# Patient Record
Sex: Female | Born: 1965
Health system: Southern US, Community
[De-identification: ages and names within clinical notes are randomized; demographics above are authoritative.]

## PROBLEM LIST (undated history)

## (undated) DIAGNOSIS — T7840XA Allergy, unspecified, initial encounter: Secondary | ICD-10-CM

## (undated) DIAGNOSIS — E785 Hyperlipidemia, unspecified: Secondary | ICD-10-CM

## (undated) DIAGNOSIS — K743 Primary biliary cirrhosis: Secondary | ICD-10-CM

## (undated) DIAGNOSIS — E119 Type 2 diabetes mellitus without complications: Secondary | ICD-10-CM

## (undated) DIAGNOSIS — K219 Gastro-esophageal reflux disease without esophagitis: Secondary | ICD-10-CM

## (undated) DIAGNOSIS — F259 Schizoaffective disorder, unspecified: Secondary | ICD-10-CM

## (undated) DIAGNOSIS — G43909 Migraine, unspecified, not intractable, without status migrainosus: Secondary | ICD-10-CM

## (undated) DIAGNOSIS — F32A Depression, unspecified: Secondary | ICD-10-CM

## (undated) DIAGNOSIS — B1920 Unspecified viral hepatitis C without hepatic coma: Secondary | ICD-10-CM

## (undated) DIAGNOSIS — F329 Major depressive disorder, single episode, unspecified: Secondary | ICD-10-CM

## (undated) HISTORY — DX: Unspecified viral hepatitis C without hepatic coma: B19.20

## (undated) HISTORY — DX: Gastro-esophageal reflux disease without esophagitis: K21.9

## (undated) HISTORY — PX: TUBAL LIGATION: SHX77

## (undated) HISTORY — DX: Hyperlipidemia, unspecified: E78.5

## (undated) HISTORY — DX: Depression, unspecified: F32.A

## (undated) HISTORY — DX: Migraine, unspecified, not intractable, without status migrainosus: G43.909

## (undated) HISTORY — DX: Allergy, unspecified, initial encounter: T78.40XA

## (undated) HISTORY — DX: Major depressive disorder, single episode, unspecified: F32.9

## (undated) HISTORY — PX: ABDOMINAL HYSTERECTOMY: SHX81

---

## 1995-07-20 HISTORY — PX: CHOLECYSTECTOMY: SHX55

## 2010-03-17 ENCOUNTER — Other Ambulatory Visit: Admission: RE | Admit: 2010-03-17 | Discharge: 2010-03-17 | Payer: Self-pay | Admitting: Family Medicine

## 2010-03-17 ENCOUNTER — Ambulatory Visit: Payer: Self-pay | Admitting: Family Medicine

## 2010-04-11 LAB — HM MAMMOGRAPHY: HM Mammogram: NEGATIVE

## 2010-04-29 ENCOUNTER — Ambulatory Visit: Payer: Self-pay | Admitting: Family Medicine

## 2010-05-14 ENCOUNTER — Inpatient Hospital Stay: Payer: Self-pay | Admitting: Psychiatry

## 2010-05-20 ENCOUNTER — Ambulatory Visit: Payer: Self-pay | Admitting: Family Medicine

## 2010-06-26 ENCOUNTER — Ambulatory Visit: Payer: Self-pay | Admitting: Unknown Physician Specialty

## 2010-06-30 ENCOUNTER — Ambulatory Visit: Payer: Self-pay | Admitting: Family Medicine

## 2010-08-19 ENCOUNTER — Institutional Professional Consult (permissible substitution): Payer: Self-pay | Admitting: Family Medicine

## 2010-08-26 ENCOUNTER — Institutional Professional Consult (permissible substitution) (INDEPENDENT_AMBULATORY_CARE_PROVIDER_SITE_OTHER): Payer: 59 | Admitting: Family Medicine

## 2010-08-26 DIAGNOSIS — R51 Headache: Secondary | ICD-10-CM

## 2010-08-26 DIAGNOSIS — F3289 Other specified depressive episodes: Secondary | ICD-10-CM

## 2010-08-26 DIAGNOSIS — Z79899 Other long term (current) drug therapy: Secondary | ICD-10-CM

## 2010-08-26 DIAGNOSIS — F329 Major depressive disorder, single episode, unspecified: Secondary | ICD-10-CM

## 2010-10-05 ENCOUNTER — Ambulatory Visit: Payer: 59 | Admitting: Family Medicine

## 2010-11-09 ENCOUNTER — Ambulatory Visit (INDEPENDENT_AMBULATORY_CARE_PROVIDER_SITE_OTHER): Payer: 59 | Admitting: Family Medicine

## 2010-11-09 DIAGNOSIS — F3289 Other specified depressive episodes: Secondary | ICD-10-CM

## 2010-11-09 DIAGNOSIS — R109 Unspecified abdominal pain: Secondary | ICD-10-CM

## 2010-11-09 DIAGNOSIS — R51 Headache: Secondary | ICD-10-CM

## 2010-11-09 DIAGNOSIS — J309 Allergic rhinitis, unspecified: Secondary | ICD-10-CM

## 2010-11-09 DIAGNOSIS — F329 Major depressive disorder, single episode, unspecified: Secondary | ICD-10-CM

## 2010-12-18 ENCOUNTER — Inpatient Hospital Stay: Payer: Self-pay | Admitting: Psychiatry

## 2011-04-14 ENCOUNTER — Encounter: Payer: Self-pay | Admitting: Family Medicine

## 2011-05-31 ENCOUNTER — Telehealth: Payer: Self-pay | Admitting: Family Medicine

## 2011-05-31 NOTE — Telephone Encounter (Signed)
Pt scheduled for 11/23 for lipid profile drawn

## 2011-05-31 NOTE — Telephone Encounter (Signed)
Okay to have blood drawn here

## 2011-06-11 ENCOUNTER — Other Ambulatory Visit: Payer: 59

## 2011-06-11 ENCOUNTER — Other Ambulatory Visit: Payer: Self-pay | Admitting: Family Medicine

## 2011-06-11 DIAGNOSIS — E785 Hyperlipidemia, unspecified: Secondary | ICD-10-CM

## 2011-06-11 LAB — LIPID PANEL
Cholesterol: 196 mg/dL (ref 0–200)
HDL: 40 mg/dL (ref >39)
LDL Cholesterol: 120 mg/dL — ABNORMAL HIGH (ref 0–99)
Triglycerides: 178 mg/dL — ABNORMAL HIGH (ref <150)
VLDL: 36 mg/dL (ref 0–40)

## 2012-03-20 ENCOUNTER — Emergency Department: Payer: Self-pay | Admitting: Emergency Medicine

## 2012-04-03 ENCOUNTER — Encounter: Payer: Self-pay | Admitting: Internal Medicine

## 2012-04-10 ENCOUNTER — Ambulatory Visit (INDEPENDENT_AMBULATORY_CARE_PROVIDER_SITE_OTHER): Payer: 59 | Admitting: Family Medicine

## 2012-04-10 ENCOUNTER — Encounter: Payer: Self-pay | Admitting: Family Medicine

## 2012-04-10 ENCOUNTER — Other Ambulatory Visit: Payer: Self-pay | Admitting: Family Medicine

## 2012-04-10 VITALS — BP 108/78 | HR 80 | Ht 62.0 in | Wt 229.0 lb

## 2012-04-10 DIAGNOSIS — K219 Gastro-esophageal reflux disease without esophagitis: Secondary | ICD-10-CM

## 2012-04-10 DIAGNOSIS — Z0000 Encounter for general adult medical examination without abnormal findings: Secondary | ICD-10-CM

## 2012-04-10 DIAGNOSIS — Z79899 Other long term (current) drug therapy: Secondary | ICD-10-CM

## 2012-04-10 DIAGNOSIS — Z23 Encounter for immunization: Secondary | ICD-10-CM

## 2012-04-10 DIAGNOSIS — R5381 Other malaise: Secondary | ICD-10-CM

## 2012-04-10 DIAGNOSIS — R5383 Other fatigue: Secondary | ICD-10-CM

## 2012-04-10 DIAGNOSIS — Z13220 Encounter for screening for lipoid disorders: Secondary | ICD-10-CM

## 2012-04-10 LAB — CBC WITH DIFFERENTIAL/PLATELET
Basophils Absolute: 0 10*3/uL (ref 0.0–0.1)
Basophils Relative: 1 % (ref 0–1)
Eosinophils Absolute: 0.3 10*3/uL (ref 0.0–0.7)
Eosinophils Relative: 3 % (ref 0–5)
HCT: 40.5 % (ref 36.0–46.0)
Hemoglobin: 13.8 g/dL (ref 12.0–15.0)
MCH: 29.6 pg (ref 26.0–34.0)
MCHC: 34.1 g/dL (ref 30.0–36.0)
MCV: 86.7 fL (ref 78.0–100.0)
Monocytes Absolute: 0.7 10*3/uL (ref 0.1–1.0)
Monocytes Relative: 8 % (ref 3–12)
Neutro Abs: 5 10*3/uL (ref 1.7–7.7)
RDW: 13.6 % (ref 11.5–15.5)

## 2012-04-10 LAB — COMPREHENSIVE METABOLIC PANEL
ALT: 72 U/L — ABNORMAL HIGH (ref 0–35)
BUN: 7 mg/dL (ref 6–23)
CO2: 25 mEq/L (ref 19–32)
Creat: 0.68 mg/dL (ref 0.50–1.10)
Total Bilirubin: 0.8 mg/dL (ref 0.3–1.2)

## 2012-04-10 LAB — LIPID PANEL
Cholesterol: 213 mg/dL — ABNORMAL HIGH (ref 0–200)
HDL: 53 mg/dL (ref >39)
Total CHOL/HDL Ratio: 4 Ratio
VLDL: 19 mg/dL (ref 0–40)

## 2012-04-10 NOTE — Patient Instructions (Addendum)
HEALTH MAINTENANCE RECOMMENDATIONS:  It is recommended that you get at least 30 minutes of aerobic exercise at least 5 days/week (for weight loss, you may need as much as 60-90 minutes). This can be any activity that gets your heart rate up. This can be divided in 10-15 minute intervals if needed, but try and build up your endurance at least once a week.  Weight bearing exercise is also recommended twice weekly.  Eat a healthy diet with lots of vegetables, fruits and fiber.  "Colorful" foods have a lot of vitamins (ie green vegetables, tomatoes, red peppers, etc).  Limit sweet tea, regular sodas and alcoholic beverages, all of which has a lot of calories and sugar.  Up to 1 alcoholic drink daily may be beneficial for women (unless trying to lose weight, watch sugars).  Drink a lot of water.  Calcium recommendations are 1200-1500 mg daily (1500 mg for postmenopausal women or women without ovaries), and vitamin D 1000 IU daily.  This should be obtained from diet and/or supplements (vitamins), and calcium should not be taken all at once, but in divided doses.  Monthly self breast exams and yearly mammograms for women over the age of 31 is recommended. Check with [facility] on [address] to see if that is where you had it done last time.  You can schedule this on your own.  Sunscreen of at least SPF 30 should be used on all sun-exposed parts of the skin when outside between the hours of 10 am and 4 pm (not just when at beach or pool, but even with exercise, golf, tennis, and yard work!)  Use a sunscreen that says "broad spectrum" so it covers both UVA and UVB rays, and make sure to reapply every 1-2 hours.  Remember to change the batteries in your smoke detectors when changing your clock times in the spring and fall.  Use your seat belt every time you are in a car, and please drive safely and not be distracted with cell phones and texting while driving.  Diet for GERD or  PUD Nutrition therapy can help ease the discomfort of gastroesophageal reflux disease (GERD) and peptic ulcer disease (PUD).  HOME CARE INSTRUCTIONS   Eat your meals slowly, in a relaxed setting.   Eat 5 to 6 small meals per day.   If a food causes distress, stop eating it for a period of time.  FOODS TO AVOID  Coffee, regular or decaffeinated.   Cola beverages, regular or low calorie.   Tea, regular or decaffeinated.   Pepper.   Cocoa.   High fat foods, including meats.   Butter, margarine, hydrogenated oil (trans fats).   Peppermint or spearmint (if you have GERD).   Fruits and vegetables if not tolerated.   Alcohol.   Nicotine (smoking or chewing). This is one of the most potent stimulants to acid production in the gastrointestinal tract.   Any food that seems to aggravate your condition.  If you have questions regarding your diet, ask your caregiver or a registered dietitian. TIPS  Lying flat may make symptoms worse. Keep the head of your bed raised 6 to 9 inches (15 to 23 cm) by using a foam wedge or blocks under the legs of the bed.   Do not lay down until 3 hours after eating a meal.   Daily physical activity may help reduce symptoms.  MAKE SURE YOU:   Understand these instructions.   Will watch your condition.   Will  get help right away if you are not doing well or get worse.  Document Released: 07/05/2005 Document Revised: 06/24/2011 Document Reviewed: 05/21/2011 [facility] Patient Information 2012 [place], [place].

## 2012-04-10 NOTE — Progress Notes (Signed)
Jessica Watkins is a 46 y.o. female who presents for a complete physical.  She is on her menstrual cycle, so GYN/pap not going to be done today.  She has the following concerns:  Urine feels warm recently.  Denies urgency, frequency or leakage of urine.  Denies odor to urine.   Heartburn--had it for 2 weeks straight, then resolved, but 2 days ago had some recurrent symptoms, and burning discomfort in her throat when drinking.  Using OTC Heartburn relief.  Drinks 1 sweet tea or mountain dew daily, also drinking grape or cranberry juice  Health Maintenance: There is no immunization history on file for this patient. Last tetanus unknown, likely a while ago Last Pap smear: 02/2010 Last mammogram: 03/2010 Last colonoscopy: never Last DEXA: never Dentist: a few years, past due Ophtho: wears glasses, goes every 2 years Exercise: walks 3x/week, 1 mile  Past Medical History  Diagnosis Date  . Allergy     RHINITIS  . Dyslipidemia   . Depression     sees psychiatrist in [place]  . Migraines    Past Surgical History  Procedure Date  . Cholecystectomy   . Tubal ligation    History   Social History  . Marital Status: Married    Spouse Name: N/A    Number of Children: N/A  . 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 Smoking status: Never Smoker   . Smokeless tobacco: Never Used   Comment: passive exposure (from son)  . Alcohol Use: No  . Drug Use: No  . Sexually Active: Not Currently   Other Topics Concern  . Not on file   Social History Narrative   Lives with son and daughter.  Separated. Son smokes in the house.  No pets   Family History  Problem Relation Age of Onset  . COPD Mother   . Cirrhosis Mother   . Kidney disease Father   . Gout Father   . Heart disease Father     CHF  . Diabetes Father   . Diabetes Maternal Grandmother   . Diabetes Paternal Grandmother   . Cancer Neg Hx    Medication: Aleve or  tylenol prn.  Seroquel per psych (doesn't remember dose).  She had been taking 4 different medications, and her moods were worse, more depressed.  Energy better since med changes made.  Allergies  Allergen Reactions  . Penicillins Rash   ROS: The patient denies anorexia, fever, headaches (resolved),  vision changes, decreased hearing, ear pain, sore throat, breast concerns, chest pain, palpitations, dizziness, syncope, dyspnea on exertion, cough, swelling, nausea, vomiting, diarrhea, constipation, abdominal pain, melena, hematochezia, hematuria, incontinence, dysuria, irregular menstrual cycles (missed one last month, but prior to that was regular), vaginal discharge, odor or itch, genital lesions, joint pains, numbness, tingling, weakness, tremor, suspicious skin lesions, abnormal bleeding/bruising, or enlarged lymph nodes.  19 pound weight gain since last visit 1.5 years ago.  Denies change in weight, but thinks she gained while she was out of work.  Moods are doing better, sometimes feels anxious.  No longer hearing the voices (much improved), denies depression.  Recently hearing voices, and was taken out of work x 2 weeks  PHYSICAL EXAM: BP 108/78  Pulse 80  Ht 5\' 2"  (1.575 m)  Wt 229 lb (103.874 kg)  BMI 41.88 kg/m2  LMP 04/08/2012  General Appearance:    Alert, cooperative, no distress, appears stated age  Head:    Normocephalic, without obvious abnormality, atraumatic  Eyes:    PERRL, conjunctiva/corneas clear, EOM's intact, fundi    benign  Ears:    Normal TM's and external ear canals  Nose:   Nares normal, mucosa normal, no drainage or sinus   tenderness  Throat:   Lips, mucosa, and tongue normal; teeth and gums normal  Neck:   Supple, no lymphadenopathy;  thyroid:  no   enlargement/tenderness/nodules; no carotid   bruit or JVD  Back:    Spine nontender, no curvature, ROM normal, no CVA     tenderness  Lungs:     Clear to auscultation bilaterally without wheezes, rales or      ronchi; respirations unlabored  Chest Wall:    No tenderness or deformity   Heart:    Regular rate and rhythm, S1 and S2 normal, no murmur, rub   or gallop  Breast Exam:    No tenderness, masses, or nipple discharge or inversion.      No axillary lymphadenopathy  Abdomen:     Soft, non-tender, nondistended, normoactive bowel sounds,    no masses, no hepatosplenomegaly  Genitalia:   Exam deferred due to menstrual cycle  Rectal:    Exam deferred due to menstrual cycle  Extremities:   No clubbing, cyanosis or edema  Pulses:   2+ and symmetric all extremities  Skin:   Skin color, texture, turgor normal, no rashes or lesions  Lymph nodes:   Cervical, supraclavicular, and axillary nodes normal  Neurologic:   CNII-XII intact, normal strength, sensation and gait; DTR's diminished at both knees and ankles, symmetric and 1+ in UE's          Psych:   Normal mood, affect, hygiene and grooming.    ASSESSMENT/PLAN:  1. Routine general medical examination at a health care facility    2. Other malaise and fatigue  Comprehensive metabolic panel, TSH, Vitamin D 25 hydroxy, CBC with Differential  3. Encounter for long-term (current) use of other medications  Comprehensive metabolic panel, Lipid panel  4. Screening for lipoid disorders  Lipid panel  5. Need for Tdap vaccination  Tdap vaccine greater than or equal to 7yo IM  6. GERD (gastroesophageal reflux disease)     GERD precautions reviewed.   Discussed monthly self breast exams and yearly mammograms after the age of 60; at least 30 minutes of aerobic activity at least 5 days/week; proper sunscreen use reviewed; healthy diet, including goals of calcium and vitamin D intake and alcohol recommendations (less than or equal to 1 drink/day) reviewed; regular seatbelt use; changing batteries in smoke detectors.  Immunization recommendations discussed (see below).  Colonoscopy recommendations reviewed--age 40.  Weight loss encouraged.  Tdap given today.   Declines flu shot  F/u 2 weeks for GYN exam

## 2012-04-11 LAB — VITAMIN D 25 HYDROXY (VIT D DEFICIENCY, FRACTURES): Vit D, 25-Hydroxy: 19 ng/mL — ABNORMAL LOW (ref 30–89)

## 2012-04-11 LAB — T4, FREE: Free T4: 0.95 ng/dL (ref 0.80–1.80)

## 2012-04-17 ENCOUNTER — Other Ambulatory Visit: Payer: Self-pay | Admitting: *Deleted

## 2012-04-17 DIAGNOSIS — E559 Vitamin D deficiency, unspecified: Secondary | ICD-10-CM

## 2012-04-17 DIAGNOSIS — R7989 Other specified abnormal findings of blood chemistry: Secondary | ICD-10-CM

## 2012-04-17 DIAGNOSIS — R945 Abnormal results of liver function studies: Secondary | ICD-10-CM

## 2012-04-17 MED ORDER — VITAMIN D (ERGOCALCIFEROL) 1.25 MG (50000 UNIT) PO CAPS: 50000.0000 [IU] | ORAL_CAPSULE | ORAL | Status: DC

## 2012-04-19 ENCOUNTER — Inpatient Hospital Stay: Payer: Self-pay | Admitting: Psychiatry

## 2012-04-19 LAB — COMPREHENSIVE METABOLIC PANEL
Albumin: 3.8 g/dL (ref 3.4–5.0)
Alkaline Phosphatase: 220 U/L — ABNORMAL HIGH (ref 50–136)
Calcium, Total: 9.1 mg/dL (ref 8.5–10.1)
Co2: 27 mmol/L (ref 21–32)
EGFR (Non-African Amer.): >60
Glucose: 123 mg/dL — ABNORMAL HIGH (ref 65–99)
Osmolality: 291 (ref 275–301)
SGPT (ALT): 68 U/L (ref 12–78)
Sodium: 145 mmol/L (ref 136–145)

## 2012-04-19 LAB — CBC
Platelet: 420 10*3/uL (ref 150–440)
RBC: 4.23 10*6/uL (ref 3.80–5.20)

## 2012-04-19 LAB — ETHANOL
Ethanol %: <0.003 % (ref 0.000–0.080)
Ethanol: <3 mg/dL

## 2012-04-19 LAB — SALICYLATE LEVEL: Salicylates, Serum: <1.7 mg/dL

## 2012-04-19 LAB — ACETAMINOPHEN LEVEL: Acetaminophen: <2 ug/mL

## 2012-04-20 LAB — LIPID PANEL
Cholesterol: 168 mg/dL (ref 0–200)
HDL Cholesterol: 46 mg/dL (ref 40–60)
Triglycerides: 73 mg/dL (ref 0–200)

## 2012-04-20 LAB — BASIC METABOLIC PANEL
Calcium, Total: 9.1 mg/dL (ref 8.5–10.1)
Co2: 27 mmol/L (ref 21–32)
Creatinine: 0.73 mg/dL (ref 0.60–1.30)
EGFR (Non-African Amer.): >60
Glucose: 100 mg/dL — ABNORMAL HIGH (ref 65–99)
Potassium: 3.5 mmol/L (ref 3.5–5.1)
Sodium: 139 mmol/L (ref 136–145)

## 2012-04-29 LAB — DRUG SCREEN, URINE
Amphetamines, Ur Screen: NEGATIVE (ref ?–1000)
Cocaine Metabolite,Ur ~~LOC~~: NEGATIVE (ref ?–300)
MDMA (Ecstasy)Ur Screen: NEGATIVE (ref ?–500)
Opiate, Ur Screen: NEGATIVE (ref ?–300)
Tricyclic, Ur Screen: NEGATIVE (ref ?–1000)

## 2012-05-03 ENCOUNTER — Ambulatory Visit: Payer: 59 | Admitting: Family Medicine

## 2012-05-04 ENCOUNTER — Ambulatory Visit (INDEPENDENT_AMBULATORY_CARE_PROVIDER_SITE_OTHER): Payer: 59 | Admitting: Family Medicine

## 2012-05-04 ENCOUNTER — Encounter: Payer: Self-pay | Admitting: Family Medicine

## 2012-05-04 ENCOUNTER — Other Ambulatory Visit (HOSPITAL_COMMUNITY)
Admission: RE | Admit: 2012-05-04 | Discharge: 2012-05-04 | Disposition: A | Discharge status: Home / Self Care | Payer: 59 | Source: Ambulatory Visit | Attending: Family Medicine | Admitting: Family Medicine

## 2012-05-04 VITALS — BP 92/58 | HR 72 | Ht 62.0 in | Wt 220.0 lb

## 2012-05-04 DIAGNOSIS — F329 Major depressive disorder, single episode, unspecified: Secondary | ICD-10-CM

## 2012-05-04 DIAGNOSIS — Z01419 Encounter for gynecological examination (general) (routine) without abnormal findings: Secondary | ICD-10-CM

## 2012-05-04 DIAGNOSIS — E559 Vitamin D deficiency, unspecified: Secondary | ICD-10-CM

## 2012-05-04 DIAGNOSIS — F3289 Other specified depressive episodes: Secondary | ICD-10-CM

## 2012-05-04 NOTE — Patient Instructions (Signed)
You had some blood in your stool today, but that may be related to some of the vaginal bleeding/spotting.  Please return the stool cards to look for microscopic blood in your stool--do this when you are NOT having any bleeding (vaginal, or hemorrhoid), and feel like everything is normal.

## 2012-05-04 NOTE — Progress Notes (Signed)
Chief Complaint  Patient presents with  . Gynecologic Exam    patient here today for pap, had CPE 04/10/12 and was on her menstrual cycle. Pt declines flu vaccine.   HPI:   Patient presents for pelvic exam and pap, as she was on her cycle at her recent physical.  Menses have been irregular, just had another cycle recently, finished yesterday.  She had bloodwork done in hospital, and reportedly normal.  She was hospitalized x 2 weeks at [facility] for depression, possibly bipolar (per pt, no records received).  She is followed by psychiatrist in [place].  She is on Seroquel, and 3 other medications--she can't recall the names and will call with names and dosages. She is doing much better now.  Hasn't started the Vitamin D yet, but plans to pick it up from the pharmacy this week.  Denies vaginal discharge, odor, itch, urinary symptoms. Denies any hot flashes, night sweats.  Past Medical History  Diagnosis Date  . Allergy     RHINITIS  . Dyslipidemia   . Depression     sees psychiatrist in [place]  . Migraines    Past Surgical History  Procedure Date  . Cholecystectomy   . Tubal ligation    History   Social History  . Marital Status: Married    Spouse Name: N/A    Number of Children: N/A  . 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 Smoking status: Never Smoker   . Smokeless tobacco: Never Used   Comment: passive exposure (from son)  . Alcohol Use: No  . Drug Use: No  . Sexually Active: Not Currently   Other Topics Concern  . Not on file   Social History Narrative   Lives with son and daughter.  Separated. Son smokes in the house.  No pets   Current Outpatient Prescriptions on File Prior to Visit  Medication Sig Dispense Refill  . Vitamin D, Ergocalciferol, (DRISDOL) 50000 UNITS CAPS Take 1 capsule (50,000 Units total) by mouth every 7 (seven) days.  4 capsule  2  not taking Vitamin D yet; taking  seroquel and 3 other psych meds.  Allergies  Allergen Reactions  . Penicillins Rash   ROS: Denies dizziness, fever, URI symptoms, urinary complaints, GI complaints or other concerns  PHYSICAL EXAM: BP 92/58  Pulse 72  Ht 5\' 2"  (1.575 m)  Wt 220 lb (99.791 kg)  BMI 40.24 kg/m2  LMP 04/29/2012 Well developed, pleasant overweight female in no distress GYN: External genitalia normal, no lesions. BUS and vagina normal.  Some brown discharge/old blood in vaginal vault.  Cervix appears normal without lesions. No cervical motion tenderness.  Uterus and adnexa normal, not enlarged, nontender.  Pap smear obtained. Rectal exam:  Normal sphincter tone, no mass. Heme + stool Psych: normal mood, full range of affect. Normal hygiene and grooming  ASSESSMENT/PLAN:  1. Routine gynecological examination  Cytology - PAP  2. Unspecified vitamin D deficiency    3. Depressive disorder, not elsewhere classified     Normal GYN exam.  Pap sent. Start rx Vitamin D Hemoccult cards given--advised to do when not having any bleeding/spotting or other possible contamination (hemorrhoids, etc).  Recently normal CBC

## 2012-05-10 ENCOUNTER — Encounter: Payer: Self-pay | Admitting: Family Medicine

## 2012-05-18 LAB — HM MAMMOGRAPHY: HM Mammogram: NEGATIVE

## 2012-07-24 ENCOUNTER — Other Ambulatory Visit: Payer: 59

## 2012-07-24 DIAGNOSIS — R945 Abnormal results of liver function studies: Secondary | ICD-10-CM

## 2012-07-24 DIAGNOSIS — R7989 Other specified abnormal findings of blood chemistry: Secondary | ICD-10-CM

## 2012-07-24 LAB — HEPATIC FUNCTION PANEL
ALT: 42 U/L — ABNORMAL HIGH (ref 0–35)
AST: 31 U/L (ref 0–37)
Albumin: 4.4 g/dL (ref 3.5–5.2)
Bilirubin, Direct: 0.1 mg/dL (ref 0.0–0.3)
Total Bilirubin: 0.5 mg/dL (ref 0.3–1.2)

## 2012-07-24 LAB — TSH: TSH: 0.659 u[IU]/mL (ref 0.350–4.500)

## 2012-07-26 ENCOUNTER — Other Ambulatory Visit: Payer: Self-pay | Admitting: *Deleted

## 2012-07-26 DIAGNOSIS — E559 Vitamin D deficiency, unspecified: Secondary | ICD-10-CM

## 2012-07-26 DIAGNOSIS — E7800 Pure hypercholesterolemia, unspecified: Secondary | ICD-10-CM

## 2012-07-26 DIAGNOSIS — R748 Abnormal levels of other serum enzymes: Secondary | ICD-10-CM

## 2013-03-16 DIAGNOSIS — Z0279 Encounter for issue of other medical certificate: Secondary | ICD-10-CM

## 2013-03-23 ENCOUNTER — Telehealth: Payer: Self-pay | Admitting: Internal Medicine

## 2013-03-23 NOTE — Telephone Encounter (Signed)
Faxed over medical records to Disability Determination service @ [phone] 

## 2013-08-21 ENCOUNTER — Telehealth: Payer: Self-pay | Admitting: Internal Medicine

## 2013-08-21 NOTE — Telephone Encounter (Signed)
Faxed over medical records to vocational rehabiltation in [place] @ [phone]

## 2013-09-13 ENCOUNTER — Telehealth: Payer: Self-pay | Admitting: Internal Medicine

## 2013-09-13 NOTE — Telephone Encounter (Signed)
Faxed over medical records to vocational rehabilitation @ [phone]

## 2013-10-17 ENCOUNTER — Emergency Department: Payer: Self-pay | Admitting: Emergency Medicine

## 2013-10-17 DIAGNOSIS — Z029 Encounter for administrative examinations, unspecified: Secondary | ICD-10-CM

## 2013-10-17 LAB — COMPREHENSIVE METABOLIC PANEL
ANION GAP: 7 (ref 7–16)
Albumin: 3.7 g/dL (ref 3.4–5.0)
Alkaline Phosphatase: 251 U/L — ABNORMAL HIGH
BUN: 9 mg/dL (ref 7–18)
Bilirubin,Total: 0.4 mg/dL (ref 0.2–1.0)
CO2: 26 mmol/L (ref 21–32)
Calcium, Total: 9.1 mg/dL (ref 8.5–10.1)
Chloride: 105 mmol/L (ref 98–107)
Creatinine: 0.97 mg/dL (ref 0.60–1.30)
EGFR (African American): >60
GLUCOSE: 104 mg/dL — AB (ref 65–99)
Osmolality: 275 (ref 275–301)
Potassium: 3.8 mmol/L (ref 3.5–5.1)
SGOT(AST): 40 U/L — ABNORMAL HIGH (ref 15–37)
SGPT (ALT): 71 U/L (ref 12–78)
SODIUM: 138 mmol/L (ref 136–145)
Total Protein: 8.3 g/dL — ABNORMAL HIGH (ref 6.4–8.2)

## 2013-10-17 LAB — CBC
HCT: 41 % (ref 35.0–47.0)
HGB: 13.5 g/dL (ref 12.0–16.0)
MCH: 29.5 pg (ref 26.0–34.0)
MCHC: 32.8 g/dL (ref 32.0–36.0)
MCV: 90 fL (ref 80–100)
Platelet: 332 10*3/uL (ref 150–440)
RBC: 4.56 10*6/uL (ref 3.80–5.20)
RDW: 13.4 % (ref 11.5–14.5)
WBC: 10.5 10*3/uL (ref 3.6–11.0)

## 2013-10-17 LAB — DRUG SCREEN, URINE

## 2013-10-17 LAB — URINALYSIS, COMPLETE
BLOOD: NEGATIVE
Bilirubin,UR: NEGATIVE
GLUCOSE, UR: NEGATIVE mg/dL (ref 0–75)
KETONE: NEGATIVE
LEUKOCYTE ESTERASE: NEGATIVE
NITRITE: NEGATIVE
PH: 7 (ref 4.5–8.0)
Protein: NEGATIVE
Specific Gravity: 1.003 (ref 1.003–1.030)
Squamous Epithelial: 4

## 2013-10-17 LAB — PREGNANCY, URINE: Pregnancy Test, Urine: NEGATIVE m[IU]/mL

## 2013-10-17 LAB — ETHANOL
Ethanol %: <0.003 % (ref 0.000–0.080)
Ethanol: <3 mg/dL

## 2013-10-25 ENCOUNTER — Telehealth: Payer: Self-pay | Admitting: Internal Medicine

## 2013-10-25 NOTE — Telephone Encounter (Signed)
Faxed over medical records to [facility] on March 16th @ [phone]

## 2014-11-05 NOTE — H&P (Signed)
PATIENT NAME:  Margarito CourserCURRIE, Yuliza C MR#:  [id number] DATE OF BIRTH:  [birth year]  DATE OF ADMISSION:  04/19/2012  IDENTIFYING INFORMATION AND CHIEF COMPLAINT: This is a 49 year old woman with a history of recurrent psychotic depression who is admitted to the hospital because of worsening of psychotic symptoms, inability to care for herself, severe thought disorder, failure of outpatient treatment.   CHIEF COMPLAINT: "I need to go home".  HISTORY OF PRESENT ILLNESS: The patient was brought to the Emergency Room by her aunt. She has been suffering from several weeks of progressive worsening of symptoms. Symptoms began with anxiety but have progressively become overwhelming with tearfulness, depression, anxiety occurring constantly. She is unable to think clearly. Initially she was just getting panicky but now she is unable to do virtually anything to care for herself. Thought disorder is prominent. Has not been eating well. Not been sleeping well. Has not endorsed specific suicidal ideation but has been progressively getting worse despite medication management as an outpatient and has been unable to work and unable to take care of her basic needs or live independently. She has been compliant recently with medication management. No substance abuse identified.   PAST PSYCHIATRIC HISTORY: The patient has a history of episodes of similar symptoms in the past. She was last in [facility] in June 2012 with a similar episode. At that time she was in the hospital for about 10 days before fully recovering. She denies having actually tried to kill herself in the past. She has most recently been on Seroquel as a primary medication with Risperdal dose recently being decreased. Had discontinued her Zoloft.   PAST MEDICAL HISTORY: The patient has no significant ongoing medical problems.   SOCIAL HISTORY: At baseline the patient works a regular job at [employer] and lives with her mother and her two young  children. Recently she has been unable to provide normal care for her family or to work her job.   SUBSTANCE ABUSE HISTORY: None identified.   FAMILY HISTORY: No known family history.   REVIEW OF SYSTEMS: The patient was a very difficult historian. Reported being anxious. Reported difficulty sleeping. Tearfulness. Admitted that she is hearing voices and feeling nervous about it. Denied acute suicidal ideation.   MENTAL STATUS EXAM: Somewhat disheveled woman who looks her stated age. Passively cooperative with the exam. Eye contact poor. Psychomotor activity grossly diminished but fidgety. She taps on her head constantly and nervously. Speech is very quiet and difficult to understand. Tends to whisper and use only single word responses. Thoughts are disorganized. Unable to provide complex answers to questions. Very focused on wanting to be released from the hospital thinking that she has to immediately go home and take care of a lot of business which she cannot actually explain. Admits to having auditory hallucinations. Denies acute suicidal or homicidal ideation. Insight and judgment impaired. Baseline fund of knowledge and memory intact but currently unable to participate in cognitive testing.   PHYSICAL EXAMINATION:   GENERAL: The patient is moderately overweight weighing 225 pounds. She does not appear to be in acute physical distress. There are no skin lesions identified. Hair distribution looks normal. Face is symmetric.   HEENT: Pupils equal and reactive. Oral mucosa normal.   NECK AND BACK: Nontender.   MUSCULOSKELETAL: Full range of motion at all extremities. Normal gait. Strength and reflexes normal throughout.   NEUROLOGICAL: Cranial nerves symmetric and normal.   LUNGS: Clear with no wheezes.   HEART: Regular rate and rhythm, normal  sounds.   ABDOMEN: Soft, nontender. Normal bowel sounds.   VITAL SIGNS: Most recent vital signs show a temperature of 98.2, pulse 97, respirations  20, blood pressure 127/85.   LABORATORY, DIAGNOSTIC, AND RADIOLOGICAL DATA: Chemistry shows alcohol undetectable. Thyroid stimulating hormone 1.08, which is normal. Chemistry panel showed slightly elevated chloride at 110, elevated glucose at 123, elevated alkaline phosphatase at 220, AST slightly elevated at 38. CBC shows a white count slightly elevated at 11.2. Acetaminophen and salicylates undetected.   MEDICATIONS: Most recent medications were:  1. Seroquel 300 mg at night.  2. Zoloft 50 mg per day.  3. Not taking the Risperdal most recently.   ALLERGIES: Penicillin.   ASSESSMENT: This is a 49 year old woman with recurrent psychotic depression versus bipolar disorder who presents with worsening of her psychosis to the point of being virtually incoherent in her thinking and having auditory hallucinations. Affect is tearful, anxious, agitated. Unable to think clearly or take care of herself. Unable to comply with medication instructions because of thought disorder. Failing outpatient treatment.   TREATMENT PLAN: Interview done. Educated patient. Supportive therapy done. Reviewed labs. I would like to get her back on her Risperdal and Zoloft. The combination of 4 mg of Risperdal a day divided up and Zoloft 50 mg a day as well as a small amount of Seroquel seem to be very effective in the past. I suspect that her decision to taper off of the Risperdal may have been a key factor in the worsening of her psychosis. The patient is agreeable to medication changes. Acutely she will be given some Ativan as well to help with anxiety and p.r.n. medicine will be provided as needed. As she improves, we will try and get her involved in groups and activities on the unit and get collateral information and work towards appropriate discharge planning.   DIAGNOSES PRINCIPLE AND PRIMARY:  AXIS I: Major depressive episode, severe, psychotic, recurrent.   SECONDARY DIAGNOSES:  AXIS I: Rule out bipolar disorder,  depressed.   AXIS II: None.   AXIS III: None.   AXIS IV: Moderate to severe from chronic being a single parent working hard.   AXIS V: Functioning at time of admission 30.   ____________________________ Audery Amel, MD jtc:drc D: 04/19/2012 18:03:55 ET T: 04/20/2012 06:07:53 ET JOB#: [id number]  cc: Audery Amel, MD, <Dictator> Audery Amel MD ELECTRONICALLY SIGNED 04/20/2012 14:02

## 2014-11-05 NOTE — Discharge Summary (Signed)
PATIENT NAME:  Jessica Watkins, Jessica Watkins MR#:  [id number] DATE OF BIRTH:  1966-02-16  DATE OF ADMISSION:  04/19/2012 DATE OF DISCHARGE:  05/01/2012  HOSPITAL COURSE: See dictated history and physical for details of admission. This 49 year old woman with a history of schizophrenia versus recurrent major depressive disorder with psychotic features was admitted to the hospital having had a relapse of symptoms of depression, hallucinations, paranoia, inability to function. She was showing signs of psychosis as well as withdrawn and dysphoric mood. She was cooperative with treatment, better insight, was impaired especially initially. She was put back on Risperdal and Seroquel and Zoloft which had been effective for her in the past. For many days in the hospital she remained socially withdrawn, made very little eye contact, spoke very little and when she did speak would acknowledge continuing to have auditory hallucinations and a sense of paranoia. Risperdal dose was eventually titrated up to 3 mg twice a day. By the time of discharge the patient is showing significant improvement over how she was last week. She is able to make good solid eye contact. She smiles appropriately. She was able to carry on a more appropriate conversation and shows improved insight. She is agreeable to staying on medication. She denies that she is having current auditory hallucinations and states that her mood feels better. Totally denies any suicidal ideation. She has outpatient follow-up treatment already in place through seeing me in the clinic and we will try to make an appointment to have her come back to see me within the next week. Her blood pressure was elevated earlier in the hospital stay. She is now on hydrochlorothiazide and her blood pressure has been under good control.   DISCHARGE MEDICATIONS:  1. Seroquel 150 mg p.o. at night.  2. Risperdal 3 mg b.i.d.  3. Zoloft 100 mg per day.  4. Cogentin 1 mg b.i.d. p.r.n. for EPS.   5. Hydrochlorothiazide 25 mg p.o. daily.   LABORATORY, DIAGNOSTIC AND RADIOLOGICAL DATA: Drug screen done a few days before discharge was negative. Admission labs showed TSH normal at 1.08. Ethanol undetectable. Chemistry showed a slightly high glucose of 123, chloride 111, alkaline phosphatase elevated at 220, AST elevated at 38. Follow-up labs showed glucose to have normalized at 100. The rest of the basic labs had normalized. Lipid panel showed cholesterol 168, triglycerides 73, HDL 46, VLDL 15, LDL 107 so overall with a pretty good looking profile. Acetaminophen and salicylates undetectable. Vitamin B12 level 452, which is normal.   MENTAL STATUS EXAM:: Alert and oriented x4. Neatly dressed and groomed. Good eye contact. Normal psychomotor activity. Speech is quiet but that is pretty normal for her. Able to have a full and complete conversation. Easy to understand. Thoughts appear logical and organized. No indication of delusional thinking. Denies suicidal or homicidal ideation. Denies hallucinations. Short and long-term memory grossly intact. Intelligence normal. Good insight and judgment.   DISPOSITION: Discharge back home with her family. Follow up with me in the clinic.   DIAGNOSIS PRINCIPLE AND PRIMARY:  AXIS I: Major depressive episode severe, recurrent with psychotic features.  SECONDARY DIAGNOSES: AXIS I: No further.   AXIS II: No diagnosis.   AXIS III: Hypertension.   AXIS IV: Moderate to severe from hard working schedule, single parenthood.   AXIS V: Functioning at time of discharge 59.   ____________________________ Audery AmelJohn T. Delorese Sellin, MD jtc:cms D: 05/01/2012 10:15:04 ET T: 05/01/2012 10:40:51 ET JOB#: [id number]  cc: Audery AmelJohn T. Kymberlee Viger, MD, <Dictator> Audery AmelJOHN T Kolyn Rozario MD ELECTRONICALLY SIGNED  05/01/2012 17:13 

## 2014-11-09 NOTE — Consult Note (Signed)
Brief Consult Note: Diagnosis: major depression recurrent psychotic.   Patient was seen by consultant.   Consult note dictated.   Recommend further assessment or treatment.   Orders entered.   Discussed with Attending MD.   Comments: Psychiatry: PAtient seen. See full note. Patient is depressed and starting to be paranoid but denies any suicidal ideation or homicidal ideation and agrees to outpatient treatment.She is not commitable. Agrees to start prozac and haldol and will be referred to outpt treatment.  Electronic Signatures: Audery Amellapacs, Kollins Fenter T (MD)  (Signed 01-Apr-15 23:05)  Authored: Brief Consult Note   Last Updated: 01-Apr-15 23:05 by Audery Amellapacs, Feliz Herard T (MD)

## 2014-11-09 NOTE — Consult Note (Signed)
PATIENT NAME:  Jessica Watkins, Jessica Watkins MR#:  [id number] DATE OF BIRTH:  April 28, 1966  DATE OF CONSULTATION:  10/17/2013  REFERRING PHYSICIAN:   CONSULTING PHYSICIAN:  Audery AmelJohn T. Axie Hayne, MD  IDENTIFYING INFORMATION AND REASON FOR CONSULTATION:  A 49 year old woman brought by her family into the Emergency Room because concerns about her mental state.  Consult for appropriate psychiatric disposition and treatment.   HISTORY OF PRESENT ILLNESS:  Information obtained from the patient and the chart.  The patient states that she lost her job some time ago and of course then lost her insurance.  She has no income currently.  She was getting unemployment for a while, but it ran out.  She now is living in a trailer and having trouble getting together the money to pay her light bill.  She has been getting increasingly anxious and worried.  Mood is feeling nervous and down.  She is starting to have more thoughts that people in her family are somehow talking about her in a negative way or dislike her or may be doing bad things to her.  She is not having hallucinations.  She is still eating and taking care of herself basically.  She denies any suicidal thoughts.  She admitted that she is not taking any psychiatric medicine regularly.  Not abusing drugs or alcohol.   PAST PSYCHIATRIC HISTORY:  This patient is known to me from inpatient stays as well as outpatient treatment.  She has a history of recurrent episodes of severe depression with psychotic features.  She had shown good response to a combination of antidepressant and antipsychotic in the past, but has had some trouble with compliance as an outpatient.  No history of suicide attempts.  No history of violence.  She has had inpatient hospitalizations, but none since 2013.   PAST MEDICAL HISTORY:  She is overweight, but has no significant ongoing medical problems and is on no prescription medicines.   SOCIAL HISTORY:  Used to work at [employer], but lost her job because of  her performance, which I imagine is probably at least in part due to her mental health problems.  She had unemployment for a while, but now has no income.  She is trying to work on filing for disability, but of course that takes a long time.  She is living in a trailer by herself.  I imagine that knowing her past history the family makes some attempt to be helpful and she probably turns them away because of her paranoia.   SUBSTANCE ABUSE HISTORY:  No substance abuse problems active.   FAMILY HISTORY:  No known family history.   CURRENT MEDICATIONS:  None.   ALLERGIES:  PENICILLIN.   REVIEW OF SYSTEMS:   She is a little bit vague in her complaints.  Admits that she has been feeling nervous.  Been feeling like she cannot concentrate well enough to get anything done.  Does not have any complaints about her sleep.  Denies that she is hearing things, but then admits that sometimes noises she hears in the environment are making her feel more anxious than usual or bothering her somehow.  Absolutely denies suicidal or homicidal ideation.  Short-term memory intact.  Long-term memory intact.  Normal fund of knowledge.  Normal intelligence.  Alert and oriented x 4.   MENTAL STATUS:  Neatly groomed woman.  She has put on weight since the last time I saw her.  It does not look like she is in acute physical distress.  Eye contact intermittent.  Psychomotor activity a little bit unsettled and fidgety.  Mood stated as being nervous and a little bit bad.  Thoughts slow, halting, not quite thought blocking.  Denies hallucinations.  Denies suicidal or homicidal ideation.  Speech easy to understand, but decreased in rate.  Affect flat.  Short and long-term memory both intact.  Normal fund of knowledge.  Alert and oriented x 4.   LABORATORY RESULTS:  Some labs have been drawn in the ER.  Chemistry panel:  Slightly elevated AST and total protein.  Alcohol negative.  Drug screen negative.  CBC unremarkable.  Urine  borderline possible infection.   ASSESSMENT:  This is a 49 year old woman known to me from past treatment who has a history of recurrent depression with psychotic features.  Right now she seems to be having mild to moderate degrees of those symptoms, but is not acutely dangerous.  She is not suicidal, not homicidal and still able to basically take care of her fundamental needs.  She has enough insight to agree to my recommendation to restart medication and to go for outpatient treatment.  I recommended to her voluntary admission to the hospital to let us most sufficiently treat her symptoms, but she declined that.  I do not think she meets commitment criteria right now.  Instead, I am going to arrange that we will make a referral for her to advanced access or whatever facility can assist with indigent patients.  We can start her back on some medicine.  I chose some medicines that should be very affordable.  I am going to give her prescriptions for Prozac 20 mg per day and Haldol 5 mg at night.  Instructed patient to get on these medicines regularly and to follow up, otherwise her symptoms are going to get worse and she is going to become even more dysfunctional and need hospitalization.  She agreed to my suggestion.   DIAGNOSIS, PRINCIPAL AND PRIMARY:  AXIS I:  Major depression, recurrent, moderate to severe, borderline psychotic features.   SECONDARY DIAGNOSES: AXIS I:  No further.  AXIS II:  Deferred.  AXIS III:  Overweight.  AXIS IV:  Severe, loss of job, no income, no insurance.  AXIS V:  Functioning at time of evaluation 39.    ____________________________ Audery Amel, MD jtc:ea D: 10/17/2013 23:13:30 ET T: 10/17/2013 23:29:34 ET JOB#: [id number]  cc: Audery Amel, MD, <Dictator> Audery Amel MD ELECTRONICALLY SIGNED 10/18/2013 9:49

## 2014-12-31 ENCOUNTER — Emergency Department
Admission: EM | Admit: 2014-12-31 | Discharge: 2014-12-31 | Disposition: A | Discharge status: Home / Self Care | Payer: PRIVATE HEALTH INSURANCE | Attending: Emergency Medicine | Admitting: Emergency Medicine

## 2014-12-31 ENCOUNTER — Encounter: Payer: Self-pay | Admitting: Emergency Medicine

## 2014-12-31 DIAGNOSIS — Z79899 Other long term (current) drug therapy: Secondary | ICD-10-CM | POA: Insufficient documentation

## 2014-12-31 DIAGNOSIS — F329 Major depressive disorder, single episode, unspecified: Secondary | ICD-10-CM | POA: Insufficient documentation

## 2014-12-31 DIAGNOSIS — F32A Depression, unspecified: Secondary | ICD-10-CM

## 2014-12-31 DIAGNOSIS — Z880 Allergy status to penicillin: Secondary | ICD-10-CM | POA: Diagnosis not present

## 2014-12-31 LAB — COMPREHENSIVE METABOLIC PANEL
ALT: 37 U/L (ref 14–54)
ANION GAP: 7 (ref 5–15)
AST: 30 U/L (ref 15–41)
Albumin: 3.9 g/dL (ref 3.5–5.0)
Alkaline Phosphatase: 164 U/L — ABNORMAL HIGH (ref 38–126)
BUN: 11 mg/dL (ref 6–20)
CALCIUM: 9 mg/dL (ref 8.9–10.3)
CHLORIDE: 107 mmol/L (ref 101–111)
CO2: 26 mmol/L (ref 22–32)
Creatinine, Ser: 0.84 mg/dL (ref 0.44–1.00)
GFR calc Af Amer: >60 mL/min (ref >60)
GLUCOSE: 104 mg/dL — AB (ref 65–99)
Potassium: 3.3 mmol/L — ABNORMAL LOW (ref 3.5–5.1)
SODIUM: 140 mmol/L (ref 135–145)
TOTAL PROTEIN: 7.5 g/dL (ref 6.5–8.1)
Total Bilirubin: 0.4 mg/dL (ref 0.3–1.2)

## 2014-12-31 LAB — URINE DRUG SCREEN, QUALITATIVE (ARMC ONLY)
Amphetamines, Ur Screen: NOT DETECTED
Barbiturates, Ur Screen: NOT DETECTED
Benzodiazepine, Ur Scrn: NOT DETECTED
CANNABINOID 50 NG, UR ~~LOC~~: NOT DETECTED
COCAINE METABOLITE, UR ~~LOC~~: NOT DETECTED
MDMA (Ecstasy)Ur Screen: NOT DETECTED
METHADONE SCREEN, URINE: NOT DETECTED
Opiate, Ur Screen: NOT DETECTED
Phencyclidine (PCP) Ur S: NOT DETECTED
Tricyclic, Ur Screen: NOT DETECTED

## 2014-12-31 LAB — URINALYSIS COMPLETE WITH MICROSCOPIC (ARMC ONLY)
BILIRUBIN URINE: NEGATIVE
Glucose, UA: NEGATIVE mg/dL
HGB URINE DIPSTICK: NEGATIVE
KETONES UR: NEGATIVE mg/dL
Nitrite: NEGATIVE
Protein, ur: NEGATIVE mg/dL
Specific Gravity, Urine: 1.025 (ref 1.005–1.030)
pH: 6 (ref 5.0–8.0)

## 2014-12-31 LAB — CBC
HEMATOCRIT: 37.9 % (ref 35.0–47.0)
HEMOGLOBIN: 12.6 g/dL (ref 12.0–16.0)
MCH: 29.5 pg (ref 26.0–34.0)
MCHC: 33.2 g/dL (ref 32.0–36.0)
MCV: 88.9 fL (ref 80.0–100.0)
Platelets: 268 10*3/uL (ref 150–440)
RBC: 4.26 MIL/uL (ref 3.80–5.20)
RDW: 13.4 % (ref 11.5–14.5)
WBC: 6.3 10*3/uL (ref 3.6–11.0)

## 2014-12-31 NOTE — ED Provider Notes (Signed)
[facility] Emergency Department Provider Note    ____________________________________________  Time seen: 1555  I have reviewed the triage vital signs and the nursing notes.   HISTORY  Chief Complaint Depression   History limited by: Not Limited   HPI VERNECIA UMBLE is a 49 y.o. female who presents to the emergency department today with vague symptoms. Patient states that she has been having congestion, tingling of her face, rash on her hand. She states that she has not been taking her psychiatric meds for one year. She does feel that perhaps somebody has put a camera in her and is watching her. She does deny any suicidal ideation or homicidal ideation.  The daughter who is with her states that she does well when she is on her medications. The daughter states that she has been doing slightly worse for the past couple of months.     Past Medical History  Diagnosis Date  . Allergy     RHINITIS  . Dyslipidemia   . Depression     sees psychiatrist in [place]  . Migraines     Patient Active Problem List   Diagnosis Date Noted  . Unspecified vitamin D deficiency 05/04/2012  . Depressive disorder, not elsewhere classified 05/04/2012    Past Surgical History  Procedure Laterality Date  . Cholecystectomy    . Tubal ligation      Current Outpatient Rx  Name  Route  Sig  Dispense  Refill  . Vitamin D, Ergocalciferol, (DRISDOL) 50000 UNITS CAPS   Oral   Take 1 capsule (50,000 Units total) by mouth every 7 (seven) days.   4 capsule   2     Allergies Penicillins  Family History  Problem Relation Age of Onset  . COPD Mother   . Cirrhosis Mother   . Kidney disease Father   . Gout Father   . Heart disease Father     CHF  . Diabetes Father   . Diabetes Maternal Grandmother   . Diabetes Paternal Grandmother   . Cancer Neg Hx     Social History History  Substance Use Topics  . Smoking status: Never Smoker   . Smokeless tobacco:  Never Used     Comment: passive exposure (from son)  . Alcohol Use: No    Review of Systems  Constitutional: Negative for fever. Cardiovascular: Negative for chest pain. Respiratory: Negative for shortness of breath. Gastrointestinal: Negative for abdominal pain, vomiting and diarrhea. Genitourinary: Negative for dysuria. Musculoskeletal: Negative for back pain. Skin: Rash on her hand Neurological: Negative for headaches, focal weakness or numbness. Psychiatric: Denies SI/HI  10-point ROS otherwise negative.  ____________________________________________   PHYSICAL EXAM:  VITAL SIGNS: ED Triage Vitals  Enc Vitals Group     BP 12/31/14 1512 108/67 mmHg     Pulse Rate 12/31/14 1512 91     Resp 12/31/14 1512 18     Temp 12/31/14 1512 97.7 F (36.5 C)     Temp Source 12/31/14 1512 Oral     SpO2 12/31/14 1512 100 %     Weight 12/31/14 1512 210 lb (95.255 kg)     Height 12/31/14 1512  (1.575 m)     Head Cir --      Peak Flow --      Pain Score 12/31/14 1513 0   Constitutional: Alert and oriented. Well appearing and in no distress. Eyes: Conjunctivae are normal. PERRL. Normal extraocular movements. ENT   Head: Normocephalic and atraumatic.  Nose: No congestion/rhinnorhea.   Mouth/Throat: Mucous membranes are moist.   Neck: No stridor. Hematological/Lymphatic/Immunilogical: No cervical lymphadenopathy. Cardiovascular: Normal rate, regular rhythm.  No murmurs, rubs, or gallops. Respiratory: Normal respiratory effort without tachypnea nor retractions. Breath sounds are clear and equal bilaterally. No wheezes/rales/rhonchi. Gastrointestinal: Soft and nontender. No distention. There is no CVA tenderness. Genitourinary: Deferred Musculoskeletal: Normal range of motion in all extremities. No joint effusions.  No lower extremity tenderness nor edema. Neurologic:  Normal speech and language. No gross focal neurologic deficits are appreciated. Speech is normal.   Skin:  Skin is warm, dry and intact. No rash noted on hands. A couple small red lesions on the lower extremities that is consistent with insect bites.  Psychiatric: Mood and affect are normal. Speech and behavior are normal. Patient exhibits appropriate insight and judgment.  ____________________________________________    LABS (pertinent positives/negatives)  Labs Reviewed  COMPREHENSIVE METABOLIC PANEL - Abnormal; Notable for the following:    Potassium 3.3 (*)    Glucose, Bld 104 (*)    Alkaline Phosphatase 164 (*)    All other components within normal limits  URINALYSIS COMPLETEWITH MICROSCOPIC ([facility] ONLY) - Abnormal; Notable for the following:    Color, Urine YELLOW (*)    APPearance CLEAR (*)    Leukocytes, UA TRACE (*)    Bacteria, UA RARE (*)    Squamous Epithelial / LPF 0-5 (*)    All other components within normal limits  CBC  URINE DRUG SCREEN, QUALITATIVE ([facility] ONLY)     ____________________________________________   EKG  None  ____________________________________________    RADIOLOGY  None  ____________________________________________   PROCEDURES  Procedure(s) performed: None  Critical Care performed: No  ____________________________________________   INITIAL IMPRESSION / ASSESSMENT AND PLAN / ED COURSE  Pertinent labs & imaging results that were available during my care of the patient were reviewed by me and considered in my medical decision making (see chart for details).  Patient here with a constellation of vague symptoms. Physical exam is concerning safe for small amount of paranoia. Patient did deny any SI or HI. Will check basic blood work. She'll he did offer to have patient be seen by psychiatry here. Patient declined offered to have psychiatry see her and states she can make an appointment with Dr. Toni Amend as an outpatient. Daughter appears comfortable with this plan.  ----------------------------------------- 9:28 PM on  12/31/2014 -----------------------------------------  Lab work without any concerning findings. Again encouraged patient to follow-up with Dr. Toni Amend. ____________________________________________   FINAL CLINICAL IMPRESSION(S) / ED DIAGNOSES  Final diagnoses:  Depression     Phineas Semen, MD 12/31/14 2128

## 2014-12-31 NOTE — ED Notes (Signed)
Pt to ed with daughter who reports pt has been speaking "out of head" for last 2 months intermittently.  Pt daughter states she has hx of paranoid depression but stopped meds without MD order.  Pt vague when asked why she is in ED today. Pt reports numbness in feet and hands and face intermittently.  Pt denies feeling depressed and states she does not want to use meds on a daily basis.  Pt states " I am concerned about my family"  States she is worried about her "families health"

## 2014-12-31 NOTE — ED Notes (Signed)
Pt presents with flat affect, stating that she has "allergies" and that she has had tingling of her face and head. Pt states she has been off her medications for more than 6 months. Pt is cooperative but guarded and doesn't volunteer any information. Denies SI/HI.

## 2014-12-31 NOTE — Discharge Instructions (Signed)
Please seek medical attention for any high fevers, chest pain, shortness of breath, change in behavior, persistent vomiting, bloody stool or any other new or concerning symptoms.  Depression Depression refers to feeling sad, low, down in the dumps, blue, gloomy, or empty. In general, there are two kinds of depression: 1. Normal sadness or normal grief. This kind of depression is one that we all feel from time to time after upsetting life experiences, such as the loss of a job or the ending of a relationship. This kind of depression is considered normal, is short lived, and resolves within a few days to 2 weeks. Depression experienced after the loss of a loved one (bereavement) often lasts longer than 2 weeks but normally gets better with time. 2. Clinical depression. This kind of depression lasts longer than normal sadness or normal grief or interferes with your ability to function at home, at work, and in school. It also interferes with your personal relationships. It affects almost every aspect of your life. Clinical depression is an illness. Symptoms of depression can also be caused by conditions other than those mentioned above, such as:  Physical illness. Some physical illnesses, including underactive thyroid gland (hypothyroidism), severe anemia, specific types of cancer, diabetes, uncontrolled seizures, heart and lung problems, strokes, and chronic pain are commonly associated with symptoms of depression.  Side effects of some prescription medicine. In some people, certain types of medicine can cause symptoms of depression.  Substance abuse. Abuse of alcohol and illicit drugs can cause symptoms of depression. SYMPTOMS Symptoms of normal sadness and normal grief include the following:  Feeling sad or crying for short periods of time.  Not caring about anything (apathy).  Difficulty sleeping or sleeping too much.  No longer able to enjoy the things you used to enjoy.  Desire to be by  oneself all the time (social isolation).  Lack of energy or motivation.  Difficulty concentrating or remembering.  Change in appetite or weight.  Restlessness or agitation. Symptoms of clinical depression include the same symptoms of normal sadness or normal grief and also the following symptoms:  Feeling sad or crying all the time.  Feelings of guilt or worthlessness.  Feelings of hopelessness or helplessness.  Thoughts of suicide or the desire to harm yourself (suicidal ideation).  Loss of touch with reality (psychotic symptoms). Seeing or hearing things that are not real (hallucinations) or having false beliefs about your life or the people around you (delusions and paranoia). DIAGNOSIS  The diagnosis of clinical depression is usually based on how bad the symptoms are and how long they have lasted. Your health care provider will also ask you questions about your medical history and substance use to find out if physical illness, use of prescription medicine, or substance abuse is causing your depression. Your health care provider may also order blood tests. TREATMENT  Often, normal sadness and normal grief do not require treatment. However, sometimes antidepressant medicine is given for bereavement to ease the depressive symptoms until they resolve. The treatment for clinical depression depends on how bad the symptoms are but often includes antidepressant medicine, counseling with a mental health professional, or both. Your health care provider will help to determine what treatment is best for you. Depression caused by physical illness usually goes away with appropriate medical treatment of the illness. If prescription medicine is causing depression, talk with your health care provider about stopping the medicine, decreasing the dose, or changing to another medicine. Depression caused by the abuse of  alcohol or illicit drugs goes away when you stop using these substances. Some adults need  professional help in order to stop drinking or using drugs. SEEK IMMEDIATE MEDICAL CARE IF:  You have thoughts about hurting yourself or others.  You lose touch with reality (have psychotic symptoms).  You are taking medicine for depression and have a serious side effect. FOR MORE INFORMATION  National Alliance on Mental Illness: www.nami.[employer] of Mental Health: http://www.maynard.net/ Document Released: 07/02/2000 Document Revised: 11/19/2013 Document Reviewed: 10/04/2011 [facility] Patient Information 2015 [place], [place]. This information is not intended to replace advice given to you by your health care provider. Make sure you discuss any questions you have with your health care provider.

## 2015-06-11 ENCOUNTER — Emergency Department
Admission: EM | Admit: 2015-06-11 | Discharge: 2015-06-11 | Disposition: A | Discharge status: Other Acute Inpatient Hospital | Payer: Medicaid Other | Attending: Emergency Medicine | Admitting: Emergency Medicine

## 2015-06-11 ENCOUNTER — Inpatient Hospital Stay
Admit: 2015-06-11 | Discharge: 2015-06-23 | DRG: 885 | Disposition: A | Discharge status: Home / Self Care | Payer: No Typology Code available for payment source | Source: Intra-hospital | Attending: Psychiatry | Admitting: Psychiatry

## 2015-06-11 DIAGNOSIS — Z9049 Acquired absence of other specified parts of digestive tract: Secondary | ICD-10-CM | POA: Diagnosis not present

## 2015-06-11 DIAGNOSIS — E669 Obesity, unspecified: Secondary | ICD-10-CM | POA: Diagnosis present

## 2015-06-11 DIAGNOSIS — Z811 Family history of alcohol abuse and dependence: Secondary | ICD-10-CM

## 2015-06-11 DIAGNOSIS — R4789 Other speech disturbances: Secondary | ICD-10-CM | POA: Diagnosis not present

## 2015-06-11 DIAGNOSIS — Z9114 Patient's other noncompliance with medication regimen: Secondary | ICD-10-CM | POA: Diagnosis not present

## 2015-06-11 DIAGNOSIS — Z880 Allergy status to penicillin: Secondary | ICD-10-CM | POA: Insufficient documentation

## 2015-06-11 DIAGNOSIS — Z825 Family history of asthma and other chronic lower respiratory diseases: Secondary | ICD-10-CM

## 2015-06-11 DIAGNOSIS — Z8249 Family history of ischemic heart disease and other diseases of the circulatory system: Secondary | ICD-10-CM

## 2015-06-11 DIAGNOSIS — F251 Schizoaffective disorder, depressive type: Secondary | ICD-10-CM | POA: Diagnosis present

## 2015-06-11 DIAGNOSIS — Z9851 Tubal ligation status: Secondary | ICD-10-CM | POA: Diagnosis not present

## 2015-06-11 DIAGNOSIS — Z833 Family history of diabetes mellitus: Secondary | ICD-10-CM | POA: Diagnosis not present

## 2015-06-11 DIAGNOSIS — Z841 Family history of disorders of kidney and ureter: Secondary | ICD-10-CM | POA: Diagnosis not present

## 2015-06-11 DIAGNOSIS — Z9119 Patient's noncompliance with other medical treatment and regimen: Secondary | ICD-10-CM | POA: Diagnosis not present

## 2015-06-11 DIAGNOSIS — F22 Delusional disorders: Secondary | ICD-10-CM | POA: Diagnosis present

## 2015-06-11 DIAGNOSIS — Z79899 Other long term (current) drug therapy: Secondary | ICD-10-CM

## 2015-06-11 DIAGNOSIS — F200 Paranoid schizophrenia: Secondary | ICD-10-CM | POA: Diagnosis not present

## 2015-06-11 DIAGNOSIS — F419 Anxiety disorder, unspecified: Secondary | ICD-10-CM | POA: Insufficient documentation

## 2015-06-11 DIAGNOSIS — Z008 Encounter for other general examination: Secondary | ICD-10-CM | POA: Diagnosis present

## 2015-06-11 LAB — COMPREHENSIVE METABOLIC PANEL
ALT: 59 U/L — ABNORMAL HIGH (ref 14–54)
AST: 48 U/L — AB (ref 15–41)
Albumin: 4.2 g/dL (ref 3.5–5.0)
Alkaline Phosphatase: 219 U/L — ABNORMAL HIGH (ref 38–126)
Anion gap: 7 (ref 5–15)
BUN: 15 mg/dL (ref 6–20)
CHLORIDE: 103 mmol/L (ref 101–111)
CO2: 27 mmol/L (ref 22–32)
Calcium: 9.7 mg/dL (ref 8.9–10.3)
Creatinine, Ser: 0.79 mg/dL (ref 0.44–1.00)
GFR calc Af Amer: >60 mL/min (ref >60)
GFR calc non Af Amer: >60 mL/min (ref >60)
GLUCOSE: 101 mg/dL — AB (ref 65–99)
POTASSIUM: 4.2 mmol/L (ref 3.5–5.1)
Sodium: 137 mmol/L (ref 135–145)
Total Bilirubin: 0.6 mg/dL (ref 0.3–1.2)
Total Protein: 8.3 g/dL — ABNORMAL HIGH (ref 6.5–8.1)

## 2015-06-11 LAB — CBC
HEMATOCRIT: 42.4 % (ref 35.0–47.0)
Hemoglobin: 14.2 g/dL (ref 12.0–16.0)
MCH: 29.4 pg (ref 26.0–34.0)
MCHC: 33.4 g/dL (ref 32.0–36.0)
MCV: 88.2 fL (ref 80.0–100.0)
Platelets: 327 10*3/uL (ref 150–440)
RBC: 4.81 MIL/uL (ref 3.80–5.20)
RDW: 13.5 % (ref 11.5–14.5)
WBC: 8.9 10*3/uL (ref 3.6–11.0)

## 2015-06-11 LAB — ETHANOL: Alcohol, Ethyl (B): <5 mg/dL (ref <5)

## 2015-06-11 LAB — SALICYLATE LEVEL: Salicylate Lvl: <4 mg/dL (ref 2.8–30.0)

## 2015-06-11 LAB — ACETAMINOPHEN LEVEL: Acetaminophen (Tylenol), Serum: <10 ug/mL — ABNORMAL LOW (ref 10–30)

## 2015-06-11 MED ORDER — ZIPRASIDONE MESYLATE 20 MG IM SOLR
20.0000 mg | Freq: Once | INTRAMUSCULAR | Status: AC
  Administered 2015-06-11: 20 mg via INTRAMUSCULAR
  Filled 2015-06-11: qty 20

## 2015-06-11 MED ORDER — BENZTROPINE MESYLATE 0.5 MG PO TABS: 0.5000 mg | ORAL_TABLET | Freq: Two times a day (BID) | ORAL | Status: DC

## 2015-06-11 MED ORDER — ACETAMINOPHEN 325 MG PO TABS
650.0000 mg | ORAL_TABLET | Freq: Four times a day (QID) | ORAL | Status: DC | PRN
  Administered 2015-06-12 – 2015-06-18 (×3): 650 mg via ORAL
  Filled 2015-06-11 (×3): qty 2

## 2015-06-11 MED ORDER — TRAZODONE HCL 100 MG PO TABS: 100.0000 mg | ORAL_TABLET | Freq: Every evening | ORAL | Status: DC | PRN

## 2015-06-11 MED ORDER — HALOPERIDOL 5 MG PO TABS
5.0000 mg | ORAL_TABLET | Freq: Two times a day (BID) | ORAL | Status: DC
  Administered 2015-06-12 – 2015-06-18 (×11): 5 mg via ORAL
  Filled 2015-06-11 (×13): qty 1

## 2015-06-11 MED ORDER — MAGNESIUM HYDROXIDE 400 MG/5ML PO SUSP: 30.0000 mL | Freq: Every day | ORAL | Status: DC | PRN

## 2015-06-11 MED ORDER — ALUM & MAG HYDROXIDE-SIMETH 200-200-20 MG/5ML PO SUSP: 30.0000 mL | ORAL | Status: DC | PRN

## 2015-06-11 MED ORDER — BENZTROPINE MESYLATE 1 MG PO TABS
0.5000 mg | ORAL_TABLET | Freq: Two times a day (BID) | ORAL | Status: DC
  Administered 2015-06-12 – 2015-06-18 (×11): 0.5 mg via ORAL
  Filled 2015-06-11 (×13): qty 1

## 2015-06-11 MED ORDER — HALOPERIDOL 5 MG PO TABS: 5.0000 mg | ORAL_TABLET | Freq: Two times a day (BID) | ORAL | Status: DC

## 2015-06-11 NOTE — Progress Notes (Signed)
Called [phone] to receive report, report RN will call back in 5 minutes to give report

## 2015-06-11 NOTE — ED Notes (Signed)
Patient presents to the ED with daughter for paranoia and anxiety.  Patient's daughter states that when she came to visit patient today patient taped many pictures of her family on her home and she was making a list of people who are trying to hurt her by using "mexicans".  Patient appears very anxious, avoids eye contact and talks about fertility, and genealogy and is not making sense.  Patient's daughter states that some people on the "list" are deceased.  Patient has been diagnosed with bipolar disorder, paranoia and depression.  Patient denies SI.  Patient's daughter states she called patient's psychiatrist and they instructed her to bring patient here.  [facility].

## 2015-06-11 NOTE — ED Notes (Addendum)
Per Psych MD, Cogentin and Haldol can be started 11/24 AM

## 2015-06-11 NOTE — ED Notes (Signed)
Attempted to get urine from patient.  Patient unable to give sample at this time.

## 2015-06-11 NOTE — Progress Notes (Signed)
This pt. doesn't need to be seen by Behavioral Medicine. Pt has been evaluated and admitted to the Inpatient Behavioral Unit by Dr. Toni Amendlapacs.    06/11/2015 Cheryl FlashNicole Octavio Matheney, MS, NCC, LPCA Therapeutic Triage Specialist

## 2015-06-11 NOTE — ED Provider Notes (Signed)
[facility] Emergency Department Provider Note  ____________________________________________  Time seen: Approximately 4:04 PM  I have reviewed the triage vital signs and the nursing notes.   HISTORY  Chief Complaint Psychiatric Evaluation  History and physical exam are both limited by the patient's acute psychosis and paranoia  HPI Jessica Courserndrea C Watkins is a 49 y.o. female with an extensive history of schizophreniaand prior hospitalizations who presents with apparently gradual onset of worsening paranoia, disorganization, possible hallucinations, and scattered activity.  Her daughter came to visit her today and found that the patient had take pictures of her family around her home that was making a list people who are trying to hurt her.  The patient is anxious and very disorganized upon arrival and is talking about unauthorized fertility treatments, asking about doctors and staff members that have been castrated, asking if there are any staff members in the emergency department who "speak African", etc.  She is refusing to comply with the procedure for getting changed out and is presenting in immediate danger to herself by looking to flee from the emergency department in an acutely psychotic state.  Her symptoms are severe.  She denies any current medical complaints.   Past Medical History  Diagnosis Date  . Allergy     RHINITIS  . Dyslipidemia   . Depression     se[place] psychiatrist in [place]  . Migraines     Patient Active Problem List   Diagnosis Date Noted  . Schizophrenia (HCC) 06/11/2015  . Noncompliance 06/11/2015  . Unspecified vitamin D deficiency 05/04/2012  . Depressive disorder, not elsewhere classified 05/04/2012    Past Surgical History  Procedure Laterality Date  . Cholecystectomy    . Tubal ligation      Current Outpatient Rx  Name  Route  Sig  Dispense  Refill  . Vitamin D, Ergocalciferol, (DRISDOL) 50000 UNITS CAPS   Oral   Take 1  capsule (50,000 Units total) by mouth every 7 (seven) days.   4 capsule   2     Allergies Penicillins  Family History  Problem Relation Age of Onset  . COPD Mother   . Cirrhosis Mother   . Kidney disease Father   . Gout Father   . Heart disease Father     CHF  . Diabetes Father   . Diabetes Maternal Grandmother   . Diabetes Paternal Grandmother   . Cancer Neg Hx     Social History Social History  Substance Use Topics  . Smoking status: Never Smoker   . Smokeless tobacco: Never Used     Comment: passive exposure (from son)  . Alcohol Use: No    Review of Systems The patient denies any current medical complaints but is acutely psychotic and history and ROS are limited  ____________________________________________   PHYSICAL EXAM:  VITAL SIGNS: ED Triage Vitals  Enc Vitals Group     BP 06/11/15 1528 116/84 mmHg     Pulse Rate 06/11/15 1528 96     Resp 06/11/15 1528 18     Temp 06/11/15 1528 98.2 F (36.8 C)     Temp Source 06/11/15 1528 Oral     SpO2 06/11/15 1528 99 %     Weight 06/11/15 1528 200 lb (90.719 kg)     Height 06/11/15 1528 5\' 5"  (1.651 m)     Head Cir --      Peak Flow --      Pain Score 06/11/15 1545 4  Pain Loc --      Pain Edu? --      Excl. in GC? --     Constitutional: Alert, disoriented and disorganized.  Avoiding eye contact. Eyes: Conjunctivae are normal. PERRL. EOMI. Head: Atraumatic. Nose: No congestion/rhinnorhea. Neck: No stridor.   Cardiovascular: Normal rate, regular rhythm. Grossly normal heart sounds.  Good peripheral circulation. Respiratory: Normal respiratory effort.  No retractions. Lungs CTAB. Gastrointestinal: Soft and nontender. No distention. No abdominal bruits. No CVA tenderness. Musculoskeletal: No lower extremity tenderness nor edema.  No joint effusions. Neurologic:  No gross focal neurologic deficits are appreciated.  No dysarthria Skin:  Skin is warm, dry and intact. No rash noted. Psychiatric:  Pressured speech, disorganized, tangential, paranoid.  ____________________________________________   LABS (all labs ordered are listed, but only abnormal results are displayed)  Labs Reviewed  COMPREHENSIVE METABOLIC PANEL - Abnormal; Notable for the following:    Glucose, Bld 101 (*)    Total Protein 8.3 (*)    AST 48 (*)    ALT 59 (*)    Alkaline Phosphatase 219 (*)    All other components within normal limits  ACETAMINOPHEN LEVEL - Abnormal; Notable for the following:    Acetaminophen (Tylenol), Serum <10 (*)    All other components within normal limits  ETHANOL  SALICYLATE LEVEL  CBC  URINE DRUG SCREEN, QUALITATIVE ([facility] ONLY)  URINALYSIS COMPLETEWITH MICROSCOPIC ([facility] ONLY)  COMPREHENSIVE METABOLIC PANEL   ____________________________________________  EKG  Not indicated ____________________________________________  RADIOLOGY   No results found.  ____________________________________________   PROCEDURES  Procedure(s) performed: None  Critical Care performed: No ____________________________________________   INITIAL IMPRESSION / ASSESSMENT AND PLAN / ED COURSE  Pertinent labs & imaging results that were available during my care of the patient were reviewed by me and considered in my medical decision making (see chart for details).  The patient is to acutely psychotic to even state whether or not she is having SI or HI.  She presents in immediate danger to herself.  I have filed a emergency involuntary commitment papers.  We are tried to de-escalate verbally but will provide Geodon 20 mg IM as needed for the patient's own safety.  She is a former patient of Dr. Toni Amend, and I saw him in the emergency department and discussed the case with him personally.  He saw the patient in the ED as well and agrees that admission to the behavioral health unit is needed.  We are simply waiting for lab results and they are making her room for her  downstairs.  ----------------------------------------- 8:39 PM on 06/11/2015 -----------------------------------------  Still awaiting urine.  Patient is resting.  Medically clear except for urine.  ____________________________________________  FINAL CLINICAL IMPRESSION(S) / ED DIAGNOSES  Final diagnoses:  Paranoid schizophrenia (HCC)      NEW MEDICATIONS STARTED DURING THIS VISIT:  Current Discharge Medication List       Loleta Rose, MD 06/11/15 2040

## 2015-06-11 NOTE — ED Notes (Signed)
Attempted to call report to behavioral medicine and was asked to call back because nurses are too busy to receive report.

## 2015-06-11 NOTE — Consult Note (Signed)
[facility] Face-to-Face Psychiatry Consult   Reason for Consult:  Consult for this 49 year old woman with a history of chronic psychotic disorder who was brought into the hospital at the request and encouragement of her daughter because of worsening psychotic symptoms Referring Physician:  Karma Greaser Patient Identification: Jessica Watkins MRN:  [id number] Principal Diagnosis: Schizophrenia [facility]) Diagnosis:   Patient Active Problem List   Diagnosis Date Noted  . Schizophrenia ([place]) [F20.9] 06/11/2015  . Noncompliance [Z91.19] 06/11/2015  . Unspecified vitamin D deficiency [E55.9] 05/04/2012  . Depressive disorder, not elsewhere classified [F32.9] 05/04/2012    Total Time spent with patient: 1 hour  Subjective:   Jessica Watkins is a 49 y.o. female patient admitted with "well my daughter just thought that I should come in here".  HPI:  Information from the patient and the chart. Patient interviewed. Chart reviewed. Patient is also known to me from several prior encounters. Patient came to the emergency room at the encouragement of her daughter. According to the patient she has been getting more and more paranoid recently. She is having constant thoughts about how people are trying to hurt her and are out to get her. Daughter notices that the patient has been putting up strange signs and decorations around her house in an effort to try and figure out the things that she is paranoid about. Patient admits that she is sleeping poorly. Has not been feeling well overall physically. She is not taking any psychiatric medicine and has not been compliant with any psychiatric treatment probably in a couple years. Denies that she's using any alcohol or drugs. Patient denies suicidal or homicidal ideation. Admits to poor functioning and poor care for herself. Does very little during the day. Here in the emergency room she has been uncooperative and mildly agitated. Review of her lab shows some elevation of some liver  function tests which appears to be a new finding.  Social history: Patient lives by herself. She has adult children from a daughter is the closest to her. Patient doesn't do much during the day. I'm not sure whether she has gotten stability yet. She used to work for [employer] but lost her job a couple years ago because of her mental health problems affecting her performance.  Medical history: Moderately overweight but as far as I know no prior diagnosis of any specific medical problems outside of her mental health issues  Substance abuse issues: Patient does not drink does not use drugs doesn't have an established problem with substance abuse  Past Psychiatric History: Patient has had a history of recurrent episodes of psychosis. Diagnosis has been variably been psychotic depression, bipolar disorder or possibly schizophrenia. At time she is been rather agitated when psychotic. I don't think she is actually tried to kill her self although she says that people have thought that of her in the past. She has had several admissions although she has not been admitted psychiatrically probably since 2014. Because she had limited financial resources inexpensive antipsychotic said primarily been used in the past. She has an established history of noncompliance  Risk to Self: Is patient at risk for suicide?: No, but patient needs Medical Clearance Risk to Others:   Prior Inpatient Therapy:   Prior Outpatient Therapy:    Past Medical History:  Past Medical History  Diagnosis Date  . Allergy     RHINITIS  . Dyslipidemia   . Depression     sees psychiatrist in [place]  . Migraines  Past Surgical History  Procedure Laterality Date  . Cholecystectomy    . Tubal ligation     Family History:  Family History  Problem Relation Age of Onset  . COPD Mother   . Cirrhosis Mother   . Kidney disease Father   . Gout Father   . Heart disease Father     CHF  . Diabetes Father   . Diabetes Maternal  Grandmother   . Diabetes Paternal Grandmother   . Cancer Neg Hx    Family Psychiatric  History: There is no known family history of any mental health problems Social History:  History  Alcohol Use No     History  Drug Use No    Social History   Social History  . Marital Status: Married    Spouse Name: N/A  . Number of Children: N/A  . Years of Education: N/A   Occupational History  . 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 Smoking status: Never Smoker   . Smokeless tobacco: Never Used     Comment: passive exposure (from son)  . Alcohol Use: No  . Drug Use: No  . Sexual Activity: Not Currently   Other Topics Concern  . Not on file   Social History Narrative   Lives with son and daughter.  Separated. Son smokes in the house.  No pets   Additional Social History:                          Allergies:   Allergies  Allergen Reactions  . Penicillins Rash    Labs:  Results for orders placed or performed during the hospital encounter of 06/11/15 (from the past 48 hour(s))  Comprehensive metabolic panel     Status: Abnormal   Collection Time: 06/11/15  3:30 PM  Result Value Ref Range   Sodium 137 135 - 145 mmol/L   Potassium 4.2 3.5 - 5.1 mmol/L   Chloride 103 101 - 111 mmol/L   CO2 27 22 - 32 mmol/L   Glucose, Bld 101 (H) 65 - 99 mg/dL   BUN 15 6 - 20 mg/dL   Creatinine, Ser 0.79 0.44 - 1.00 mg/dL   Calcium 9.7 8.9 - 10.3 mg/dL   Total Protein 8.3 (H) 6.5 - 8.1 g/dL   Albumin 4.2 3.5 - 5.0 g/dL   AST 48 (H) 15 - 41 U/L   ALT 59 (H) 14 - 54 U/L   Alkaline Phosphatase 219 (H) 38 - 126 U/L   Total Bilirubin 0.6 0.3 - 1.2 mg/dL   GFR calc non Af Amer >60 >60 mL/min   GFR calc Af Amer >60 >60 mL/min    Comment: (NOTE) The eGFR has been calculated using the CKD EPI equation. This calculation has not been validated in all clinical situations. eGFR's persistently <60 mL/min signify possible Chronic Kidney Disease.    Anion gap 7 5 - 15   Ethanol (ETOH)     Status: None   Collection Time: 06/11/15  3:30 PM  Result Value Ref Range   Alcohol, Ethyl (B) <5 <5 mg/dL    Comment:        LOWEST DETECTABLE LIMIT FOR SERUM ALCOHOL IS 5 mg/dL FOR MEDICAL PURPOSES ONLY   Salicylate level     Status: None   Collection Time: 06/11/15  3:30 PM  Result Value Ref Range   Salicylate Lvl <1.6 2.8 - 30.0 mg/dL  Acetaminophen level  Status: Abnormal   Collection Time: 06/11/15  3:30 PM  Result Value Ref Range   Acetaminophen (Tylenol), Serum <10 (L) 10 - 30 ug/mL    Comment:        THERAPEUTIC CONCENTRATIONS VARY SIGNIFICANTLY. A RANGE OF 10-30 ug/mL MAY BE AN EFFECTIVE CONCENTRATION FOR MANY PATIENTS. HOWEVER, SOME ARE BEST TREATED AT CONCENTRATIONS OUTSIDE THIS RANGE. ACETAMINOPHEN CONCENTRATIONS >150 ug/mL AT 4 HOURS AFTER INGESTION AND >50 ug/mL AT 12 HOURS AFTER INGESTION ARE OFTEN ASSOCIATED WITH TOXIC REACTIONS.   CBC     Status: None   Collection Time: 06/11/15  3:30 PM  Result Value Ref Range   WBC 8.9 3.6 - 11.0 K/uL   RBC 4.81 3.80 - 5.20 MIL/uL   Hemoglobin 14.2 12.0 - 16.0 g/dL   HCT 42.4 35.0 - 47.0 %   MCV 88.2 80.0 - 100.0 fL   MCH 29.4 26.0 - 34.0 pg   MCHC 33.4 32.0 - 36.0 g/dL   RDW 13.5 11.5 - 14.5 %   Platelets 327 150 - 440 K/uL    Current Facility-Administered Medications  Medication Dose Route Frequency Provider Last Rate Last Dose  . benztropine (COGENTIN) tablet 0.5 mg  0.5 mg Oral BID Gonzella Lex, MD      . haloperidol (HALDOL) tablet 5 mg  5 mg Oral BID Gonzella Lex, MD      . traZODone (DESYREL) tablet 100 mg  100 mg Oral QHS PRN Gonzella Lex, MD      . ziprasidone (GEODON) injection 20 mg  20 mg Intramuscular Once Hinda Kehr, MD       Current Outpatient Prescriptions  Medication Sig Dispense Refill  . Vitamin D, Ergocalciferol, (DRISDOL) 50000 UNITS CAPS Take 1 capsule (50,000 Units total) by mouth every 7 (seven) days. 4 capsule 2    Musculoskeletal: Strength & Muscle  Tone: within normal limits Gait & Station: normal Patient leans: N/A  Psychiatric Specialty Exam: Review of Systems  Constitutional: Negative.   HENT: Negative.   Eyes: Negative.   Respiratory: Negative.   Cardiovascular: Negative.   Gastrointestinal: Negative.   Musculoskeletal: Negative.   Skin: Negative.   Neurological: Negative.   Psychiatric/Behavioral: Positive for hallucinations and memory loss. Negative for depression, suicidal ideas and substance abuse. The patient is nervous/anxious and has insomnia.     Blood pressure 116/84, pulse 93, temperature 98.2 F (36.8 C), temperature source Oral, resp. rate 18, height _0  (1.651 m), weight 90.719 kg (200 lb), last menstrual period 04/29/2012, SpO2 99 %.Body mass index is 33.28 kg/(m^2).  General Appearance: Disheveled  Eye Contact::  Minimal  Speech:  Slow  Volume:  Decreased  Mood:  Anxious  Affect:  Constricted  Thought Process:  Tangential  Orientation:  Full (Time, Place, and Person)  Thought Content:  Delusions, Ideas of Reference:   Paranoia and Paranoid Ideation  Suicidal Thoughts:  No  Homicidal Thoughts:  No  Memory:  Immediate;   Good Recent;   Fair Remote;   Fair  Judgement:  Impaired  Insight:  Lacking  Psychomotor Activity:  Psychomotor Retardation  Concentration:  Poor  Recall:  [place]: Fair  Akathisia:  No  Handed:  Right  AIMS (if indicated):     Assets:  Housing Physical Health Social Support  ADL's:  Intact  Cognition: Impaired,  Mild  Sleep:      Treatment Plan Summary: Daily contact with patient to assess and evaluate symptoms and progress in treatment, Medication  management and Plan Because of her acute psychotic state, her lack of outpatient treatment, long history of noncompliance poor self-care and established history of mental illness I think it's by far the kindest thing to admit her to the hospital. Emergency room physician and filed commitment papers.  Patient will be admitted to the psychiatry ward. I'm going to go ahead and order Haldol and a small dose of Cogentin to be given orally twice a day. Will also be trazodone as needed for sleep. I will re-order her chemistry labs tomorrow. If her liver function tests and creatinine continued to have problems perhaps a medicine consult would be in order. Patient advised to the plan and is not very thrilled about it but has not been violent so far. We will go ahead and check her hemoglobin A1c and TSH and lipid panel  Disposition: Recommend psychiatric Inpatient admission when medically cleared. Supportive therapy provided about ongoing stressors.  Julita Ozbun 06/11/2015 5:00 PM

## 2015-06-11 NOTE — ED Notes (Signed)
Psych MD at bedside

## 2015-06-12 ENCOUNTER — Encounter: Payer: Self-pay | Admitting: Psychiatry

## 2015-06-12 DIAGNOSIS — E669 Obesity, unspecified: Secondary | ICD-10-CM

## 2015-06-12 DIAGNOSIS — F251 Schizoaffective disorder, depressive type: Principal | ICD-10-CM

## 2015-06-12 LAB — COMPREHENSIVE METABOLIC PANEL
ALBUMIN: 4.1 g/dL (ref 3.5–5.0)
ALT: 56 U/L — AB (ref 14–54)
AST: 34 U/L (ref 15–41)
Alkaline Phosphatase: 228 U/L — ABNORMAL HIGH (ref 38–126)
Anion gap: 8 (ref 5–15)
BILIRUBIN TOTAL: 0.6 mg/dL (ref 0.3–1.2)
BUN: 15 mg/dL (ref 6–20)
CHLORIDE: 107 mmol/L (ref 101–111)
CO2: 25 mmol/L (ref 22–32)
CREATININE: 0.68 mg/dL (ref 0.44–1.00)
Calcium: 9.7 mg/dL (ref 8.9–10.3)
GFR calc Af Amer: >60 mL/min (ref >60)
GLUCOSE: 113 mg/dL — AB (ref 65–99)
POTASSIUM: 4.1 mmol/L (ref 3.5–5.1)
Sodium: 140 mmol/L (ref 135–145)
TOTAL PROTEIN: 8.2 g/dL — AB (ref 6.5–8.1)

## 2015-06-12 LAB — LIPID PANEL
CHOL/HDL RATIO: 4.9 ratio
Cholesterol: 276 mg/dL — ABNORMAL HIGH (ref 0–200)
HDL: 56 mg/dL (ref >40)
LDL CALC: 177 mg/dL — AB (ref 0–99)
Triglycerides: 215 mg/dL — ABNORMAL HIGH (ref <150)
VLDL: 43 mg/dL — ABNORMAL HIGH (ref 0–40)

## 2015-06-12 LAB — HEMOGLOBIN A1C: HEMOGLOBIN A1C: 5.5 % (ref 4.0–6.0)

## 2015-06-12 LAB — TSH: TSH: 0.874 u[IU]/mL (ref 0.350–4.500)

## 2015-06-12 NOTE — Plan of Care (Signed)
Problem: Ineffective individual coping Goal: LTG: Patient will report a decrease in negative feelings Outcome: Not Progressing Remains upset that she is in the hospital  Blames it on  Her family

## 2015-06-12 NOTE — Progress Notes (Signed)
Admission Note: Admitted from Ed, BIB daughter due to Persecutory Paranoia of some "Mexicans" in her neighborhood "trying to hurt me, I am also responsible for my son who is now in jail, my daughter said it will be a good idea to come and talk with Dr. Toni Amendlapacs." Lost her job of 20 years from [employer], "they let me go because some co staffs are very nosey .." Patient searched by 2 nurses on admission for contrabands, none found on patient or in her belongings. Gross skin intact, no open wounds, no piercing, no tattoos.
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
## 2015-06-12 NOTE — Tx Team (Signed)
Initial Interdisciplinary Treatment Plan   PATIENT STRESSORS: Financial difficulties Medication change or noncompliance Occupational concerns   PATIENT STRENGTHS: Average or above average intelligence Capable of independent living Motivation for treatment/growth   PROBLEM LIST: Problem List/Patient Goals Date to be addressed Date deferred Reason deferred Estimated date of resolution  Depressive Symptoms 06/12/2015     Thoughts Disorder 06/12/2015     Unemployment 06/12/2015     "I will like to get better and get      My job back at [employer]"                               DISCHARGE CRITERIA:  Improved stabilization in mood, thinking, and/or behavior Motivation to continue treatment in a less acute level of care Verbal commitment to aftercare and medication compliance  PRELIMINARY DISCHARGE PLAN: Outpatient therapy Placement in alternative living arrangements  PATIENT/FAMIILY INVOLVEMENT: This treatment plan has been presented to and reviewed with the patient, Jessica Courserndrea C Watkins, and/or family member.  The patient and family have been given the opportunity to ask questions and make suggestions.  Jessica Watkins 06/12/2015, 3:37 AM

## 2015-06-12 NOTE — Progress Notes (Signed)
D: Patient has paced hall all morning,and evening  No interaction with her peers. No ADL's or personal chores . Stated she didn't need to see her family any more . Stated she didn't need any medication . Patient did take her am medications  . Didn't really care to know about the medication received . Paranoid with meds , allowed patient to see them opened in front of her. Patient responding to internal stimuli . Vigilant with surroundings.  A: Encourage patient participation with unit programming . Instruction  Given on  Medication , verbalize understanding. R: Voice no other concerns. Staff continue to monitor

## 2015-06-12 NOTE — Progress Notes (Signed)
   06/12/15 1000  Clinical Encounter Type  Visited With Patient  Visit Type Initial;Spiritual support  Referral From Nurse  Consult/Referral To Chaplain  Spiritual Encounters  Spiritual Needs Emotional  Provided pastoral presence and emotional support to patient on unit.  Asbury Automotive GroupChaplain Jassmin Kemmerer-pager [phone]

## 2015-06-12 NOTE — BHH Suicide Risk Assessment (Signed)
[facility]   Nursing information obtained from:    Demographic factors:    Current Mental Status:    Loss Factors:    Historical Factors:    Risk Reduction Factors:    Total Time spent with patient: 1 hour Principal Problem: Schizoaffective disorder, depressive type ([facility]) Diagnosis:   Patient Active Problem List   Diagnosis Date Noted  . Schizoaffective disorder, depressive type ([facility]) [F25.1] 06/12/2015  . Obesity [E66.9] 06/12/2015  . Unspecified vitamin D deficiency [E55.9] 05/04/2012     Continued Clinical Symptoms:  Alcohol Use Disorder Identification Test Final Score (AUDIT): 1 The "Alcohol Use Disorders Identification Test", Guidelines for Use in Primary Care, Second Edition.  World Science writerHealth Organization [facility](WHO). Score between 0-7:  no or low risk or alcohol related problems. Score between 8-15:  moderate risk of alcohol related problems. Score between 16-19:  high risk of alcohol related problems. Score 20 or above:  warrants further diagnostic evaluation for alcohol dependence and treatment.   CLINICAL FACTORS:   Depression:   Delusional Schizophrenia:   Paranoid or undifferentiated type Currently Psychotic Previous Psychiatric Diagnoses and Treatments    Psychiatric Specialty Exam: Physical Exam  ROS    COGNITIVE FEATURES THAT CONTRIBUTE TO RISK:  None    SUICIDE RISK:   Mild:  Suicidal ideation of limited frequency, intensity, duration, and specificity.  There are no identifiable plans, no associated intent, mild dysphoria and related symptoms, good self-control (both objective and subjective assessment), few other risk factors, and identifiable protective factors, including available and accessible social support.  PLAN OF CARE: Admit to behavioral health  Medical Decision Making:  Established Problem, Worsening (2)  I certify that inpatient services furnished can reasonably be expected to improve the patient's condition.    Jimmy FootmanHernandez-Gonzalez,  Dru 06/12/2015, 11:34 AM

## 2015-06-12 NOTE — BH Specialist Note (Signed)
Completed enrollment for [employer].  Enrollment continued to be in pending status in excess of 8 hours. Enrollment # [id number]. Attempted to complete TAR without enrollment approval, TAR unable to be completed. TAR # [id number].

## 2015-06-13 MED ORDER — IBUPROFEN 600 MG PO TABS
600.0000 mg | ORAL_TABLET | Freq: Four times a day (QID) | ORAL | Status: DC | PRN
  Administered 2015-06-14 – 2015-06-22 (×7): 600 mg via ORAL
  Filled 2015-06-13 (×6): qty 1

## 2015-06-13 NOTE — BHH Group Notes (Signed)
BHH Group Notes:  (Nursing/MHT/Case Management/Adjunct)  Date:  06/13/2015  Time:  6:04 PM  Type of Therapy:  Psychoeducational Skills  Participation Level:  Did Not Attend   Darrow BussingMarly S Simranjit Thayer 06/13/2015, 6:04 PM

## 2015-06-13 NOTE — Plan of Care (Signed)
Problem: [facility] Patient Education Outcome: Progressing Patient with depressed affect and withdrawn, guarded behavior. Denies SI/HI/AVH at this time. Resistive to taking meds as ordered by MD. Daughter informs staff " Mother is paranoid and delusional at home, please monitor when taking her meds". Patient takes meds for writer this am with support and encouragement. Minimal interaction with peers, guarded with staff. Fair adls. Safety maintained.
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
## 2015-06-13 NOTE — Progress Notes (Signed)
Patient refused to take her medications, noting she does not have a need for antipsychotics. She reports her daughter brought her in because of the pictures she found on her wall, but notes they were just her way to cope with the anxiety of not having a job. She states she is no longer married and must provide for herself so having no job is a significant problem. She denies SI, HI, and AVH. She remained pleasant, cooperative, and interactive with this Clinical research associatewriter. She noted tooth pain on the upper and lower left side of her mouth, for which she was given Tylenol. No other needs or distress were noted.

## 2015-06-13 NOTE — Plan of Care (Signed)
Problem: Spiritual Needs Goal: Ability to function at adequate level Outcome: Progressing Patient independent with ADL's.   Problem: Ineffective individual coping Goal: LTG: Patient will report a decrease in negative feelings Outcome: Progressing Pt reports being lonely and anxious simply because she is not working. She does not acknowledge any need for mental health services and states her daughter does not understand how she was coping with the stress of not having any income and being alone.

## 2015-06-13 NOTE — [facility] Group Notes (Signed)
[facility] LCSW Group Therapy  06/13/2015 2:41 PM  Type of Therapy:  Group Therapy  Participation Level:  Did Not Attend   Leni Pankonin T, MSW, LCSWA 06/13/2015, 2:41 PM  
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
## 2015-06-14 MED ORDER — NAPROXEN 500 MG PO TABS
250.0000 mg | ORAL_TABLET | Freq: Two times a day (BID) | ORAL | Status: AC
  Administered 2015-06-14 – 2015-06-16 (×4): 250 mg via ORAL
  Filled 2015-06-14 (×4): qty 1

## 2015-06-14 NOTE — [facility] Group Notes (Signed)
[facility] LCSW Group Therapy  06/14/2015 1:06 PM  Type of Therapy:  Group Therapy  Participation Level:  Did Not Attend   Modes of Intervention:  Discussion, Education, Socialization and Support  Summary of Progress/Problems: Todays topic: Grudges  Patients will be encouraged to discuss their thoughts, feelings, and behaviors as to why one holds on to grudges and reasons why people have grudges. Patients will process the impact of grudges on their daily lives and identify thoughts and feelings related to holding grudges. Patients will identify feelings and thoughts related to what life would look like without grudges.   Jessica Watkins L Jessica Watkins MSW, LCSWA  06/14/2015, 1:06 PM

## 2015-06-14 NOTE — Progress Notes (Signed)
Patient is pleasant, but continues to refuse evening medications. She states, " I don't think I need those medicines", referring to the Haldol. When told what the medications helps to treat she denies any problem other than anxiety due to her situation of needing a job for her living expenses. She fully denies any possibility for the need of help with her mental health state. She denies SI, HI, and AVH. She does continue to complain of lower left mouth pain and was given Ibuprofen. Upon reassessment she was asleep. No other needs or distress were noted. Present and interactive in the milieu.
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
## 2015-06-14 NOTE — Progress Notes (Signed)
Pt has been seclusive to her room. Pt's mood and affect has been depressed . Pt has been compliant with taking po meds with lots of encouragement. Pt has not attended any unit activities. Pt denies SI and A/V hallucinations.
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
## 2015-06-15 NOTE — Plan of Care (Signed)
Problem: Ineffective individual coping Goal: LTG: Patient will report a decrease in negative feelings Outcome: Not Progressing Patient continues to endorse significant anxiety surrounding her lack of employment. She endorses no other issue and will not acknowledge the possibility of any other problem. Goal: STG: Pt will be able to identify effective and ineffective STG: Pt will be able to identify effective and ineffective coping patterns  Outcome: Not Progressing Patient is not able to identify effective vs ineffective coping skills.  Goal: STG: Patient will remain free from self harm Outcome: Progressing No self harm.  Goal: STG:Pt. will utilize relaxation techniques to reduce stress STG: Patient will utilize relaxation techniques to reduce stress levels  Outcome: Progressing Patient paces in the halls.

## 2015-06-15 NOTE — [facility] Group Notes (Signed)
[facility] LCSW Group Therapy  06/15/2015 1:02 PM  Type of Therapy:  Group Therapy  Participation Level:  Did Not Attend  Modes of Intervention:  Discussion, Education, Socialization and Support  Summary of Progress/Problems: Pt will identify unhealthy thoughts and how they impact their emotions and behavior. Pt will be encouraged to discuss these thoughts, emotions and behaviors with the group.   Renee Erb L Aleasha Fregeau MSW, LCSWA  06/15/2015, 1:02 PM

## 2015-06-15 NOTE — Plan of Care (Signed)
Problem: Ineffective individual coping Goal: LTG: Patient will report a decrease in negative feelings Outcome: Not Progressing Pt continues to endorse anxiety, and continues to deny her need for treatment.  Goal: STG: Patient will remain free from self harm Outcome: Progressing No self harm.

## 2015-06-15 NOTE — Progress Notes (Signed)
Pt has been seclusive to her room. Pt's mood and affect has been depressed . Pt has been compliant with taking po meds with lots of encouragement. Pt has attended some unit activities with encouragement from peers. Pt denies SI and A/V hallucinations. Pt has been confused and some what disoriented .

## 2015-06-16 MED ORDER — QUETIAPINE FUMARATE 100 MG PO TABS
100.0000 mg | ORAL_TABLET | Freq: Two times a day (BID) | ORAL | Status: DC
Start: 2015-06-16 — End: 2015-06-21
  Administered 2015-06-16 – 2015-06-20 (×6): 100 mg via ORAL
  Filled 2015-06-16 (×9): qty 1

## 2015-06-16 NOTE — Progress Notes (Signed)
Patient was med compliant with significant prompting. Her daughter visited and notes she will bring in a list of the medications she was on when she was at her highest functioning level. The daughter was reassuring and encouraged the patient to be compliant with treatment. The patient continues to deny her need for treatment, stating she is only experiencing anxiety. She was encouraged to talk to the doctors and participate in her treatment plan. She denies current SI, HI, and AVH. The patient's only distress was jaw pain, which she stated came on "when she turned the corner to the med room". She notes prior to walking around the corner she had no pain at all. Her affect is flat and she is guarded in her interactions.

## 2015-06-16 NOTE — Progress Notes (Signed)
Recreation Therapy Notes  Date: 11.28.16 Time: 3:20 pm Location: Craft Room  Group Topic: Self-expression  Goal Area(s) Addresses:  Patient will effectively use art as a means of self-expression. Patient will recognize positive benefit of self-expression. Patient will be able to identify one emotion experienced during group session. Patient will identify use of art as a coping skill.  Behavioral Response: Did not attend   Intervention: Two Faces of Me  Activity: Patients were given a blank face worksheet and instructed to draw a line down the middle. On one side of the face, patients were instructed to draw or write how they felt when they were admitted to the hospital. On the other side of the face, patients were instructed to draw or write how they want to feel when they are d/c.  Education: LRT educated patients on different forms of self-expression.  Education Outcome: Patient did not attend group.   Clinical Observations/Feedback: Patient did not attend group.  Jacquelynn CreeGreene,Zamariya Neal M, LRT/CTRS 06/16/2015 4:35 PM
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
## 2015-06-16 NOTE — [facility] Group Notes (Signed)
[facility] Group Notes:  (Nursing/MHT/Case Management/Adjunct)  Date:  06/16/2015  Time:  2:48 AM  Type of Therapy:  Group Therapy  Participation Level:  Active  Participation Quality:  Appropriate  Affect:  Appropriate  Cognitive:  Appropriate  Insight:  Good  Engagement in Group:  Engaged  Modes of Intervention:  n/a  Summary of Progress/Problems:  Veva Holesshley Imani Satia Winger 06/16/2015, 2:48 AM

## 2015-06-16 NOTE — Clinical Social Work Note (Signed)
CSW met with patient to discuss consent for staff to speak with her daughter Jessica Watkins. Patient refused to sign consent and only wanted this writer speaking with her daughter Jessica Watkins if patient is present in room. CSW had patient brought on the BMU and met with patient to discuss discharge planning and treatment planning. Patient's daughter mentioned that patient medication needs to be changed to Seroquel as she is very paranoid and disorganized unlike when she was on Seroquel. Patient's daughter also wanted patient's purse to pursue disability resources for patient while she is hospitalized and patient verbally consented to her daughter taking her purse home. Patient was still paranoid and feared that someone wanted to harm her daughter and son as well. Patient asked to discharge but CSW explained that the treatment team wanted to see her stabilized on medications first but would work with her on a discharge plan.

## 2015-06-16 NOTE — Progress Notes (Signed)
Patient observed walking slowly in the hallways. She stated that she was doing fine. Patient denied SI and denied AH/VH. Patient was encouraged to attend groups but she refused; stated that she did not like the groups. When asked what groups she preferred, she stated she did not like any of them. Patient was also hesitant to take her medications this morning. She said they make her drowsy. Medication education done. Patient verbalized understanding.

## 2015-06-16 NOTE — [facility] Group Notes (Signed)
[facility] Group Notes:  (Nursing/MHT/Case Management/Adjunct)  Date:  06/16/2015  Time:  2:17 PM  Type of Therapy:  Psychoeducational Skills  Participation Level:  Did Not Attend  Pa  Jessica Watkins M Jessica Watkins 06/16/2015, 2:17 PM

## 2015-06-16 NOTE — Plan of Care (Signed)
Problem: Ineffective individual coping Goal: STG: Pt will be able to identify effective and ineffective STG: Pt will be able to identify effective and ineffective coping patterns  Outcome: Not Progressing Patient encouraged to attend groups but she stated that she was no going to go to groups because she did not like them.

## 2015-06-16 NOTE — [facility] Group Notes (Signed)
[facility]  06/16/2015 3:56 PM  Type of Therapy:  Group Therapy  Participation Level:  Did Not Attend   Lulu Ridingngle, Yarexi Pawlicki T , MSW, LCSWA 06/16/2015, 3:56 PM
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
## 2015-06-17 NOTE — [facility] Group Notes (Signed)
[facility] Group Notes:  (Nursing/MHT/Case Management/Adjunct)  Date:  06/17/2015  Time:  1:50 PM  Type of Therapy:  Psychoeducational Skills  Participation Level:  Did Not Attend  Lynelle SmokeCara Travis Lemuel [facility] HospitalMadoni 06/17/2015, 1:50 PM

## 2015-06-17 NOTE — [facility] Suicide Risk Assessment (Signed)
[facility] INPATIENT:  Family/Significant Other Suicide Prevention Education  Suicide Prevention Education:  Education Completed; Jessica Watkins (daughter) [phone] (patient refused to sign consent but gave verbal consent as long as patient could be in the room with her daughter during conversations) has been identified by the patient as the family member/significant other with whom the patient will be residing, and identified as the person(s) who will aid the patient in the event of a mental health crisis (suicidal ideations/suicide attempt).  With written consent from the patient, the family member/significant other has been provided the following suicide prevention education, prior to the and/or following the discharge of the patient.  The suicide prevention education provided includes the following:  Suicide risk factors  Suicide prevention and interventions  National Suicide Hotline telephone number  [facility] assessment telephone number  [facility] Emergency Assistance 911  [facility] and/or Residential Mobile Crisis Unit telephone number  Request made of family/significant other to:  Remove weapons (e.g., guns, rifles, knives), all items previously/currently identified as safety concern.    Remove drugs/medications (over-the-counter, prescriptions, illicit drugs), all items previously/currently identified as a safety concern.  The family member/significant other verbalizes understanding of the suicide prevention education information provided.  The family member/significant other agrees to remove the items of safety concern listed above.  Lulu RidingIngle, Aycen Porreca T, MSW, LCSWA 06/17/2015, 10:21 AM

## 2015-06-17 NOTE — Progress Notes (Signed)
Observed in the milieu up and about, thoughts are altered, nonsensical, illogical, ranting, quietly threatening, "I supposed to be discharged today, if I am not discharged tomorrow, you guys will have a problem because I will be out welcomed my stay .Marland Kitchen...they said I have to donate everything (patient named body parts), concrete floor are bad for your back .....'

## 2015-06-17 NOTE — Plan of Care (Signed)
Problem: Ineffective individual coping Goal: STG: Patient will remain free from self harm Outcome: Progressing Medications (Haldol, Cogentin and Seroquel) administered as ordered by the physician, medications Therapeutic Effects, SEs and Adverse effects discussed, questions encouraged; no PRN given, 15 minute checks maintained for safety, clinical and moral support provided, patient encouraged to continue to express feelings and demonstrate safe care. Patient remain free from harm, will continue to monitor.
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
## 2015-06-17 NOTE — Progress Notes (Signed)
Hesitatnt to take medications but did after much persuasion.

## 2015-06-17 NOTE — Plan of Care (Signed)
Problem: Alteration in mood Goal: LTG-Patient reports reduction in suicidal thoughts (Patient reports reduction in suicidal thoughts and is able to verbalize a safety plan for whenever patient is feeling suicidal)  Outcome: Progressing Patient denies SI.      

## 2015-06-17 NOTE — Progress Notes (Signed)
Recreation Therapy Notes  Date: 11.29.16 Time: 3:00 pm Location: Craft Room  Group Topic: Goal Setting  Goal Area(s) Addresses:  Patient will write down a goal. Patient will write down at least one positive reinforcement statement. Patient will verbalize benefit of setting goals.  Behavioral Response: Did not attend  Intervention: Step By Step  Activity: Patients were given a foot worksheet and instructed to write a goal on the inside of the foot. Patients were instructed to write positive reinforcement statements on the outside of the foot to help motivate themselves to keep working on their goals.  Education: LRT educated patients on healthy ways to celebrate achieving their goals.  Education Outcome: Patient did not attend group.  Clinical Observations/Feedback: Patient did not attend group.  Etty Isaac M, LRT/CTRS 06/17/2015 4:18 PM 

## 2015-06-17 NOTE — Progress Notes (Signed)
D: Affect flat , depressed mood Patient remains to voice of her family poising her . Patient has paced hall all shift . Compliant of her feet but dose not want her shoes  Because of the strings . No group participation, Limited interaction with her peers Thought process remains altered. Voice concerns  Around going home . Voice of medication making her sleepy, did not take her Seroquel.  A: Encourage patient participation with unit programming . Instruction  Given on  Medication , verbalize understanding. R: Voice no other concerns. Staff continue to monitor

## 2015-06-17 NOTE — Tx Team (Signed)
Interdisciplinary Treatment Plan Update (Adult)  Date:  06/17/2015 Time Reviewed:  10:12 AM  Progress in Treatment: Attending groups: No. Participating in groups:  No. Taking medication as prescribed:  Yes. Tolerating medication:  Yes. Family/Significant othe contact made:  Yes, individual(s) contacted:  Jessica Watkins's daughter Delancey Moraes visited with CSW and Jessica Watkins. Jessica Watkins would not sign consent form but gave verbal consent Jessica Watkins understands diagnosis:  No. Jessica Watkins denies paranoid symptoms  Discussing Jessica Watkins identified problems/goals with staff:  Yes. Medical problems stabilized or resolved:  Yes. Denies suicidal/homicidal ideation: Yes. Issues/concerns per Jessica Watkins self-inventory:  Yes. and As evidenced by:  Jessica Watkins's report fearing people are trying to harm her and her children Other:  New problem(s) identified: No, Describe:  none reported  Discharge Plan or Barriers: Jessica Watkins admitted for paranoia and psychotic depression. Jessica Watkins is divorced and has experienced loss in the past 2 years and is no longer able to work. Jessica Watkins's daughter is supporting Jessica Watkins in applying for disability however Jessica Watkins was fired from last job due to psychosis and could be eligible for benefits and family is recommended to contact HR of her last employer. Jessica Watkins will stabilize on medication and discharge home with mental health follow up in [facility].   Reason for Continuation of Hospitalization: Delusions  Depression Other; describe parania  Comments: Jessica Watkins was stable on Seroquel after her last hospitalization in 2014. Jessica Watkins remains paranoid.   Estimated length of stay: 5-7 sdays  New goal(s):  Review of initial/current Jessica Watkins goals per problem list:    1.  Goal(s):Jessica Watkins to participate in care palnning  Met:  No  Target date:by discharge  2.  Goal (s): decrease symptoms of psychosis   Met:  No  Target date:by discharge    Attendees: Physician:  Camie Patience, MD  11/29/201610:12 AM  Nursing:   Polly Cobia, RN 11/29/201610:12 AM  Other:  Carmell Austria, [place] 11/29/201610:12 AM  Other:   11/29/201610:12 AM  Other:   11/29/201610:12 AM  Other:  11/29/201610:12 AM  Other:  11/29/201610:12 AM  Other:  11/29/201610:12 AM  Other:  11/29/201610:12 AM  Other:  11/29/201610:12 AM  Other:  11/29/201610:12 AM  Other:   11/29/201610:12 AM   Scribe for Treatment Team:   Keene Breath,, MSW, LCSWA   06/17/2015, 10:12 AM

## 2015-06-17 NOTE — Plan of Care (Signed)
Problem: Ineffective individual coping Goal: LTG: Patient will report a decrease in negative feelings Outcome: Not Progressing Patient remains delusional  And paranoid .

## 2015-06-18 MED ORDER — HALOPERIDOL 5 MG PO TABS
10.0000 mg | ORAL_TABLET | Freq: Two times a day (BID) | ORAL | Status: DC
Start: 2015-06-18 — End: 2015-06-20
  Administered 2015-06-18 – 2015-06-20 (×4): 10 mg via ORAL
  Filled 2015-06-18 (×4): qty 2

## 2015-06-18 MED ORDER — BENZTROPINE MESYLATE 1 MG PO TABS
1.0000 mg | ORAL_TABLET | Freq: Two times a day (BID) | ORAL | Status: DC
  Administered 2015-06-18 – 2015-06-23 (×9): 1 mg via ORAL
  Filled 2015-06-18 (×10): qty 1

## 2015-06-18 NOTE — Progress Notes (Signed)
D: Presents with sad flat affect. Constantly at nurses station with requests to go home. Med compliant, but suspicious. Prn giving for complaints of pain. No interaction with peers. Continues to question medications and says cause drowsiness.  A: Encouragement and support offered. Encouraged to attend group.  R: Reinforcement needed.Pt remains safe on unit with q 15 min checks.

## 2015-06-18 NOTE — [facility] Group Notes (Signed)
[facility]   06/18/2015 11:12 AM  Patient did not attend.  Jenel LucksJasmine Lewis, Clinical Social Work Intern 06/18/15

## 2015-06-18 NOTE — [facility] Group Notes (Signed)
[facility] Group Notes:  (Nursing/MHT/Case Management/Adjunct)  Date:  06/18/2015  Time:  3:05 AM  Type of Therapy:  Group Therapy  Participation Level:  Active  Participation Quality:  Appropriate  Affect:  Appropriate  Cognitive:  Appropriate  Insight:  Appropriate  Engagement in Group:  Engaged  Modes of Intervention:  n/a  Summary of Progress/Problems:  Jessica Watkins 06/18/2015, 3:05 AM
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
## 2015-06-18 NOTE — [facility] Group Notes (Signed)
[facility] Group Notes:  (Nursing/MHT/Case Management/Adjunct)  Date:  06/18/2015  Time:  2:23 PM  Type of Therapy:  Psychoeducational Skills  Participation Level:  Did Not Attend  Jessica Watkins [name] 06/18/2015, 2:23 PM 

## 2015-06-18 NOTE — Plan of Care (Signed)
Problem: Ineffective individual coping Goal: STG: Patient will remain free from self harm Outcome: Progressing No self harm reported or observed     

## 2015-06-18 NOTE — Progress Notes (Signed)
Recreation Therapy Notes  Date: 11.30.16 Time: 3:00 pm Location: Craft Room  Group Topic: Self-esteem  Goal Area(s) Addresses:  Patient will write at least one positive trait about self. Patient will verbalize benefit of having a healthy self-esteem.  Behavioral Response: Did not attend  Intervention: I Am  Activity: Patients were given a worksheet with the letter I on it and instructed to write as many positive traits about themselves inside the letter.  Education: LRT educated patients on ways they can increase their self-esteem.  Education Outcome: Patient did not attend group.  Clinical Observations/Feedback: Patient did not attend group.  Mycal Conde M, LRT/CTRS 06/18/2015 4:06 PM 

## 2015-06-19 NOTE — Progress Notes (Signed)
NUTRITION NOTE:   Jessica Watkins is a 49 y.o. female with Schizoaffective disorder, depressive type (HCC)  PMH:  Past Medical History  Diagnosis Date  . Allergy     RHINITIS  . Dyslipidemia   . Depression     sees psychiatrist in [place]  . Migraines     Diet Order: Regular  Current Nutrition: recorded po intake 79% on average  Anthropometrics:  Body mass index is 39.51 kg/(m^2).    Lab Results  Component Value Date   HGBA1C 5.5 06/11/2015   Lipid Profile:     Component Value Date/Time   CHOL 276* 06/11/2015 1550   CHOL 168 04/20/2012 0612   TRIG 215* 06/11/2015 1550   TRIG 73 04/20/2012 0612   HDL 56 06/11/2015 1550   HDL 46 04/20/2012 0612   CHOLHDL 4.9 06/11/2015 1550   VLDL 43* 06/11/2015 1550   VLDL 15 04/20/2012 0612   LDLCALC 177* 06/11/2015 1550   LDLCALC 107* 04/20/2012 0612   Meds reviewed.  Pt is at no nutrition risk due to diagnosis/current problem, Regular diet order, >75% po intake. No nutrition intervention warranted at this time. Will sign off. Please re-consult RD if nutritional issues arise.   Romelle Starcherate Jailani Hogans MS, RD, LDN [phone] Pager

## 2015-06-19 NOTE — Plan of Care (Signed)
Problem: Alteration in mood Goal: STG-Patient is able to discuss feelings and issues (Patient is able to discuss feelings and issues leading to depression)  Patient has discussed issues during shift. States her pain level and other problems she is dealing with.

## 2015-06-19 NOTE — [facility] Group Notes (Signed)
[facility] Group Notes:  (Nursing/MHT/Case Management/Adjunct)  Date:  06/19/2015  Time:  2:01 PM  Type of Therapy:  Group Therapy  Participation Level:  Did Not Attend   Jessica Watkins 06/19/2015, 2:01 PM 
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
## 2015-06-19 NOTE — Progress Notes (Signed)
Recreation Therapy Notes  Date: 12.01.16 Time: 3:00 pm Location: Craft Room  Group Topic: Leisure Education  Goal Area(s) Addresses:  Patient will identify activities for each letter of the alphabet. Patient will verbalize ability to integrate positive leisure into life post d/c. Patient will verbalize ability to use leisure as a Associate Professorcoping skill.  Behavioral Response: Attentive, Interactive  Intervention: Leisure Alphabet  Activity: Patients were given a leisure Information systems manageralphabet worksheet and instructed to write positive leisure activities for each letter of the alphabet.  Education: LRT educated patients on what they need to participate in leisure  Education Outcome: Acknowledges education/In group clarification offered  Clinical Observations/Feedback: Patient completed activity by writing healthy leisure activities down. Patient contributed to group discussion by stating some healthy leisure activities. Patient asked what she should do when she is being bugged explained she does not want to go to groups or be in the day room. LRT encouraged patient to go to group and be in the day room. Patient persisted and LRT informed patient she would talk to patient after group. After group, patient stated she made herself a badge and talked about her name and her nickname and how she wanted to write a book. Patient stated she thought it was a double standard for her to be told her needed to go to group if she wanted to be d/c. LRT stated that groups are here to help her and encouraged her to go to group.   Jacquelynn CreeGreene,Reesa Gotschall M, LRT/CTRS 06/19/2015 4:05 PM

## 2015-06-19 NOTE — [facility] Group Notes (Signed)
[facility]  06/19/2015 10:04 AM  Type of Therapy:  Group Therapy  Participation Level:  Did Not Attend   Lulu Ridingngle, Kastin Cerda T , MSW, LCSWA 06/19/2015, 10:04 AM

## 2015-06-19 NOTE — Progress Notes (Signed)
D: Patient  Remains altered thought , stated she is ready to go home . Stated the hospital wanted her here for the money . Voice of spies  And the truth not being told. Patient appearance  Noted t have notes all around her  Neck  In brown paper. Two pairs of sock on on foot with one of the cut open and laid flat . Hair unkept. Limited interactions with her peers and staff . Appetite fair No unit programming  This shift. Patient refused seroquel A: Encourage patient participation with unit programming . Instruction  Given on  Medication , verbalize understanding. R: Voice no other concerns. Staff continue to monitor

## 2015-06-19 NOTE — Plan of Care (Signed)
Problem: Ineffective individual coping Goal: STG: Pt will be able to identify effective and ineffective STG: Pt will be able to identify effective and ineffective coping patterns  Outcome: Not Progressing clontinue to voice of wanting to go home , remains to respond to internal stimuli.

## 2015-06-19 NOTE — Progress Notes (Signed)
D: Patient's affect is flat. Patient appears mildly confused. Complained of pain in feet at a 3. Denies SI/HI. States when she talks to somebody, she feels like their thoughts are coming into her thoughts. She states, "it's sort of like telepathy."  A: Medication was given with education. Encouragement was given. Med for pain was given. R: Pain decreased. Patient was hesitant at for to take the medication but eventually took it, including the seroquel. Patient has been pleasant.

## 2015-06-19 NOTE — Clinical Social Work Note (Signed)
CSW met with patient and patient is still paranoid and not wanting to sign consent forms. CSW discussed [facility] [phone] and patient is refusing at this time.

## 2015-06-20 MED ORDER — HALOPERIDOL 5 MG PO TABS
10.0000 mg | ORAL_TABLET | Freq: Three times a day (TID) | ORAL | Status: DC
Start: 2015-06-20 — End: 2015-06-21
  Administered 2015-06-20: 10 mg via ORAL
  Filled 2015-06-20 (×2): qty 2

## 2015-06-20 NOTE — Plan of Care (Signed)
Problem: Alteration in thought process Goal: LTG-Patient behavior demonstrates decreased signs psychosis (Patient behavior demonstrates decreased signs of psychosis to the point the patient is safe to return home and continue treatment in an outpatient setting.) Outcome: Not Progressing Still express delusional ideations.

## 2015-06-20 NOTE — Progress Notes (Signed)
Recreation Therapy Notes  Date: 12.02.16 Time: 3:00 pm Location: Craft Room  Group Topic: Problem solving, Communication, Teamwork  Goal Area(s) Addresses:  Patient will effectively work with peer towards shared goal. Patient will identify skills used to make activity successful. Patient will identify benefit of using group skills effectively post d/c.  Behavioral Response: Attentive, Interactive  Intervention: [employer] Tower  Activity: Patients were given 2 minutes to strategize on how they were going to build a free standing tower out of 15 pipe cleaners. After approximately 5 minutes of building, patients were instructed to put their dominant hand behind their back. After approximately 4 minutes, patients were instructed not to talk to each other.  Education: LRT educated patients on healthy support systems.  Education Outcome: In group clarification offered  Clinical Observations/Feedback: Patient worked with peers to build tower. Patient contributed to group discussion by stating how she had a diificult time communicating to her coworkers after her daughter had been diagnoses with Crohn's Disease. Her coworkers thought she was being disrespectful by not talking about it, but she just did not want to talk about it.  Jacquelynn CreeGreene,Elizzie Westergard M, LRT/CTRS 06/20/2015 4:54 PM

## 2015-06-20 NOTE — Plan of Care (Signed)
Problem: Ineffective individual coping Goal: STG: Patient will remain free from self harm Outcome: Progressing Patient has remained free from harm since admission.      

## 2015-06-20 NOTE — Progress Notes (Signed)
Patient is calm & cooperative on approach.Still have some delusional ideations about somebody is spying on her family.Denies suicidal ideation & AV hallucination.Appropriate with staff & peers.Compliant with medications.

## 2015-06-20 NOTE — Progress Notes (Signed)
D: Patient's affect is flat. Patient appears mildly confused. Very disorganized. States the Dr told her she could leave in few days and that it's been a few days. Denies pain. Denies SI/HI. States when she talks to somebody, she feels like their thoughts are coming into her thoughts. Patient still describes it as telepathy. A: Medication was given with education. Encouragement was given.  R: Patient was hesitant at for to take the medication but eventually took it, including the seroquel. Patient has been pleasant.

## 2015-06-20 NOTE — Clinical Social Work Note (Signed)
CSW spoke with Phillip Heallyssa Odonnell (daughter) [phone] and notified that patient would not discharge today as she remains paranoid and delusional claiming she has ESP. Patient is refusing ACT services but [facility] but need [facility] from [facility] but patient is refusing and will only get 7 day supply of meds at discharge.

## 2015-06-20 NOTE — [facility] Group Notes (Signed)
[facility] LCSW Group Therapy  06/20/2015 4:12 PM  Type of Therapy:  Group Therapy  Participation Level:  Did Not Attend  Modes of Intervention:  Discussion, Education, Socialization and Support  Summary of Progress/Problems: Feelings around Relapse. Group members discussed the meaning of relapse and shared personal stories of relapse, how it affected them and others, and how they perceived themselves during this time. Group members were encouraged to identify triggers, warning signs and coping skills used when facing the possibility of relapse. Social supports were discussed and explored in detail.   Jessica Watkins L Jessica Watkins MSW, LCSWA  06/20/2015, 4:12 PM 
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
## 2015-06-20 NOTE — Tx Team (Signed)
Interdisciplinary Treatment Plan Update (Adult)  Date:  06/20/2015 Time Reviewed:  3:00 PM  Progress in Treatment: Attending groups: No. Participating in groups:  No. Taking medication as prescribed:  Yes. Tolerating medication:  Yes. Family/Significant othe contact made:  Yes, individual(s) contacted:  Jessica Watkins (daughter) [phone] Patient understands diagnosis:  No. Patient denies paranoid symptoms  Discussing patient identified problems/goals with staff:  Yes. Medical problems stabilized or resolved:  Yes. Denies suicidal/homicidal ideation: Yes. Issues/concerns per patient self-inventory:  Yes. and As evidenced by:  patient's report fearing people are trying to harm her and her children Other:  New problem(s) identified: No, Describe:  none reported  Discharge Plan or Barriers: Patient admitted for paranoia and psychotic depression. Patient is divorced and has experienced loss in the past 2 years and is no longer able to work. Patient's daughter is supporting patient in applying for disability however patient was fired from last job due to psychosis and could be eligible for benefits and family is recommended to contact HR of her last employer. Patient will stabilize on medication and discharge home with mental health follow up in [facility].   Reason for Continuation of Hospitalization: Delusions  Depression Other; describe parania  Comments: Patient remains paranoid and delusional believes she has ESP and will continue medication adjustment through weekend.  Estimated length of stay: up to 5 sdays  New goal(s):  Review of initial/current patient goals per problem list:    1.  Goal(s):patient to participate in care palnning  Met:  No  Target date:by discharge  2.  Goal (s): decrease symptoms of psychosis   Met:  No  Target date:by discharge    Attendees: Physician:  Camie Patience, MD 12/2/20163:00 PM  Nursing:   Tyler Pita, RN 12/2/20163:00 PM   Other:  Carmell Austria, [place] 12/2/20163:00 PM  Other:   12/2/20163:00 PM  Other:   12/2/20163:00 PM  Other:  12/2/20163:00 PM  Other:  12/2/20163:00 PM  Other:  12/2/20163:00 PM  Other:  12/2/20163:00 PM  Other:  12/2/20163:00 PM  Other:  12/2/20163:00 PM  Other:   12/2/20163:00 PM   Scribe for Treatment Team:   Keene Breath,, MSW, [place]   06/20/2015, 3:00 PM

## 2015-06-20 NOTE — Progress Notes (Signed)
  [facility]  Will you be returning to the same living situation after discharge:  Yes,  home with daughter At discharge, do you have transportation home?: Yes,  patient's daughter will pick up Do you have the ability to pay for your medications: No. Patient is referred to Medication Management clinic [phone]  Release of information consent forms completed and in the chart;  Patient's signature needed at discharge.  Patient to Follow up at: Follow-up Information    Follow up with [facility]. Go on 06/23/2015.   Why:  For follow-up care appt Monday 06/23/15 at 8:00am (walk in hours are MWF 8-3)   Contact information:   [address] [place] Ph [phone] Fax [phone]      Next level of care provider [facility] Link:no  Patient denies SI/HI: Yes,  denies SI/HI    Safety Planning and Suicide Prevention discussed: Yes,  SPE discussed with patient and her daughter Phillip Heallyssa Loya [phone]  Have you used any form of tobacco in the last 30 days? (Cigarettes, Smokeless Tobacco, Cigars, and/or Pipes): No  Has patient been referred to the Quitline?: N/A patient is not a smoker  Lulu Ridingngle, Legion Discher T, MSW, LCSWA 06/20/2015, 11:50 AM

## 2015-06-21 ENCOUNTER — Encounter: Payer: Self-pay | Admitting: Psychiatry

## 2015-06-21 MED ORDER — QUETIAPINE FUMARATE 200 MG PO TABS: 200.0000 mg | ORAL_TABLET | Freq: Every day | ORAL | Status: DC

## 2015-06-21 MED ORDER — CITALOPRAM HYDROBROMIDE 20 MG PO TABS
20.0000 mg | ORAL_TABLET | Freq: Every day | ORAL | Status: DC
  Administered 2015-06-21 – 2015-06-23 (×3): 20 mg via ORAL
  Filled 2015-06-21 (×3): qty 1

## 2015-06-21 MED ORDER — QUETIAPINE FUMARATE 25 MG PO TABS
250.0000 mg | ORAL_TABLET | Freq: Every day | ORAL | Status: DC
  Administered 2015-06-21 – 2015-06-22 (×2): 250 mg via ORAL
  Filled 2015-06-21: qty 2
  Filled 2015-06-21: qty 1

## 2015-06-21 MED ORDER — ATORVASTATIN CALCIUM 20 MG PO TABS
10.0000 mg | ORAL_TABLET | Freq: Every day | ORAL | Status: DC
  Administered 2015-06-21 – 2015-06-23 (×3): 10 mg via ORAL
  Filled 2015-06-21 (×4): qty 1

## 2015-06-21 MED ORDER — HALOPERIDOL 5 MG PO TABS
5.0000 mg | ORAL_TABLET | Freq: Three times a day (TID) | ORAL | Status: DC
  Administered 2015-06-21 – 2015-06-23 (×8): 5 mg via ORAL
  Filled 2015-06-21 (×8): qty 1

## 2015-06-21 NOTE — [facility] Group Notes (Signed)
[facility] Group Notes:  (Nursing/MHT/Case Management/Adjunct)  Date:  06/21/2015  Time:  3:28 PM  Type of Therapy:  Psychoeducational Skills  Participation Level:  Active  Participation Quality:  Appropriate, Attentive and Sharing  Affect:  Appropriate  Cognitive:  Alert and Appropriate  Insight:  Appropriate and Good  Engagement in Group:  Engaged  Modes of Intervention:  Discussion, Education and Support  Summary of Progress/Problems:  Jessica Watkins 06/21/2015, 3:28 PM

## 2015-06-21 NOTE — Plan of Care (Signed)
Problem: Alteration in thought process Goal: LTG-Patient behavior demonstrates decreased signs psychosis (Patient behavior demonstrates decreased signs of psychosis to the point the patient is safe to return home and continue treatment in an outpatient setting.)  Outcome: Progressing Patient dose not display  All the notes  around her neck  As she has in the past

## 2015-06-21 NOTE — Plan of Care (Signed)
Problem: Alteration in thought process Goal: STG-Patient does not respond to command hallucinations Outcome: Progressing Patient is not noted to be responding to any internal stimuli.

## 2015-06-21 NOTE — Progress Notes (Signed)
D: Patient stated slept fair last night .Stated appetite is good and energy level Is normal. Stated concentration is good . Stated on Depression scale 0 , hopeless 0 and anxiety 2.( low 0-10 high) Denies suicidal homicidal ideations . No auditory hallucinations No pain concerns . Appropriate ADL'S. Interacting with peers and staff. Continue to pace hall during shift. Voice of increased communication. Continue to voice of ESP And Telepathy. ( people reading her thoughts or incretion of thoughts)  A: Encourage patient participation with unit programming . Instruction Given on Medication , verbalize understanding. R: Voice no other concerns. Staff continue to monitor

## 2015-06-21 NOTE — [facility] Group Notes (Signed)
[facility] LCSW Group Therapy  06/21/2015 3:59 PM  Type of Therapy: Group Therapy  Participation Level: Minimal  Participation Quality: Attentive  Affect: Depressed  Cognitive: Alert  Insight: Limited  Engagement in Therapy: Limited  Modes of Intervention: Discussion, Education, Socialization and Support  Summary of Progress/Problems: Todays topic: Grudges Patients will be encouraged to discuss their thoughts, feelings, and behaviors as to why one holds on to grudges and reasons why people have grudges. Patients will process the impact of grudges on their daily lives and identify thoughts and feelings related to holding grudges. Patients will identify feelings and thoughts related to what life would look like without grudges.Pt attended group and stayed the entire time. She sat quietly and listened to other group members.   Sempra EnergyCandace L Leontine Radman MSW, LCSWA  06/21/2015, 3:59 PM
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
## 2015-06-22 NOTE — Progress Notes (Signed)
D: Patient affect flat. She appears more logical then previous assessments. States she's no longer having issues with people's thoughts coming into mind. Denies SI/HI/AVH. Patient states she has pain in her gums but refuses pain meds. Patient's only request is that she have a referral to an optometrist and dentist after discharge. A: Medication was given with education. Encouragement was provided.  R: Patient was cooperative with medication. Patient currently sleeping.

## 2015-06-22 NOTE — [facility] Group Notes (Signed)
[facility] LCSW Group Therapy  06/22/2015 12:56 PM  Type of Therapy:  Group Therapy  Participation Level:  Minimal  Participation Quality:  Attentive  Affect:  Flat  Cognitive:  Alert  Insight:  Improving  Engagement in Therapy:  Improving  Modes of Intervention:  Discussion, Education, Socialization and Support  Summary of Progress/Problems: Pt will identify unhealthy thoughts and how they impact their emotions and behavior. Pt will be encouraged to discuss these thoughts, emotions and behaviors with the group. Sue Lushndrea attended group and stayed the entire time. She sat quietly and listened to other group members    Sempra EnergyCandace L Shonna Deiter MSW, LCSWA  06/22/2015, 12:56 PM

## 2015-06-22 NOTE — Plan of Care (Signed)
Problem: Alteration in thought process Goal: LTG-Patient behavior demonstrates decreased signs psychosis (Patient behavior demonstrates decreased signs of psychosis to the point the patient is safe to return home and continue treatment in an outpatient setting.)  Outcome: Progressing Patient is visible in the milieu seen socializing with other patients. Patient states she no longer feels like other people's voices are entering her mind.

## 2015-06-23 MED ORDER — QUETIAPINE FUMARATE 50 MG PO TABS: 250.0000 mg | ORAL_TABLET | Freq: Every day | ORAL | Status: DC

## 2015-06-23 MED ORDER — BENZTROPINE MESYLATE 1 MG PO TABS: 1.0000 mg | ORAL_TABLET | Freq: Two times a day (BID) | ORAL | Status: DC

## 2015-06-23 MED ORDER — CITALOPRAM HYDROBROMIDE 20 MG PO TABS: 20.0000 mg | ORAL_TABLET | Freq: Every day | ORAL | Status: DC

## 2015-06-23 MED ORDER — HALOPERIDOL 5 MG PO TABS: 5.0000 mg | ORAL_TABLET | Freq: Three times a day (TID) | ORAL | Status: DC

## 2015-06-23 NOTE — [facility] Group Notes (Signed)
[facility] Group Notes:  (Nursing/MHT/Case Management/Adjunct)  Date:  06/23/2015  Time:  1:36 PM  Type of Therapy:  Psychoeducational Skills  Participation Level:  Active  Participation Quality:  Attentive  Affect:  Appropriate  Cognitive:  Appropriate  Insight:  Good  Engagement in Group:  Supportive  Modes of Intervention:  Discussion  Summary of Progress/Problems:  Jessica Watkins 06/23/2015, 1:36 PM

## 2015-06-23 NOTE — Progress Notes (Signed)
Patient denies SI/HI, denies A/V hallucinations. Patient verbalizes understanding of discharge instructions, follow up care and prescriptions. Patient given all belongings from  locker. Patient escorted out by staff, transported by family. 

## 2015-06-23 NOTE — BHH Suicide Risk Assessment (Signed)
[facility]   Demographic Factors:     Physical Exam  ROS  Blood pressure 127/82, pulse 86, temperature 98.4 F (36.9 C), temperature source Oral, resp. rate 20, height 5\' 3"  (1.6 m), weight 223 lb (101.152 kg), last menstrual period 04/29/2012, SpO2 98 %.Body mass index is 39.51 kg/(m^2).          Suicide Risk:  Minimal: No identifiable suicidal ideation.  Patients presenting with no risk factors but with morbid ruminations; may be classified as minimal risk based on the severity of the depressive symptoms  Principal Problem: Schizoaffective disorder, depressive type [facility](HCC) Discharge Diagnoses:  Patient Active Problem List   Diagnosis Date Noted  . Schizoaffective disorder, depressive type (HCC) [F25.1] 06/12/2015  . Obesity [E66.9] 06/12/2015  . Unspecified vitamin D deficiency [E55.9] 05/04/2012    Follow-up Information    Follow up with [facility]. Go on 06/23/2015.   Why:  For follow-up care appt Monday 06/23/15 at 8:00am (walk in hours are MWF 8-3)   Contact information:   [address] [place], [place] Ph [phone] Fax [phone]      Plan Of Care/Follow-up recommendations:  Activity:  as tolerated  Is patient on multiple antipsychotic therapies at discharge:  Yes,   Do you recommend tapering to monotherapy for antipsychotics?  Yes   Has Patient had three or more failed trials of antipsychotic monotherapy by history:  Yes,   Antipsychotic medications that previously failed include:   2.  please see the H/P.  Recommended Plan for Multiple Antipsychotic Therapies: Taper to monotherapy as described:  [facility]    Margarita Ranahalla, Timithy Arons K 06/23/2015, 3:41 PM

## 2015-06-23 NOTE — Progress Notes (Signed)
  [facility] :  Will you be returning to the same living situation after discharge:  Yes,  patient will discharge home At discharge, do you have transportation home?: Yes,  patient's sister will pick up at discharge Do you have the ability to pay for your medications: No. Patient [facility] to [facility] [phone]  Release of information consent forms completed and in the chart;  Patient's signature needed at discharge.  Patient to Follow up at: Follow-up Information    Follow up with [facility]. Go on 06/27/2015.   Why:  For follow-up care appt Friday 06/27/15 at 7:00am (walk in hours are MWF 8-3). Patient will followup with [facility] unless [facility] as [facility] and patient is awaiting assessment at home   Contact information:   [address] [place], [place] Ph [phone] Fax [phone]      Schedule an appointment as soon as possible for a visit with [facility].   Why:  For follow-up care appt for assessment for [facility]. [facility] will contact patient after discharge.   Contact information:   [address] [place], [place] Ph [phone] Fax [phone]       Next level of care provider has access to [facility] Link:no  Patient denies SI/HI: Yes,  patient denies SI/HI    Safety Planning and Suicide Prevention discussed: Yes,  SPE discussed with patient and Phillip Heallyssa Taliaferro (daughter) [phone]   Have you used any form of tobacco in the last 30 days? (Cigarettes, Smokeless Tobacco, Cigars, and/or Pipes): No  Has patient been referred to the Quitline?: N/A patient is not a smoker  Jessica Watkins, Jessica Watkins, MSW, LCSWA 06/23/2015, 4:32 PM

## 2015-06-23 NOTE — BHH Group Notes (Signed)
BHH LCSW Group Therapy  06/23/2015 5:00 PM  Type of Therapy:  Group Therapy  Participation Level:  Minimal  Participation Quality:  Attentive  Affect:  Flat  Cognitive:  Alert  Insight:  Limited  Engagement in Therapy:  Limited  Modes of Intervention:  Discussion, Education, Socialization and Support  Summary of Progress/Problems: Emotional Regulation: Patients will identify both negative and positive emotions. They will discuss emotions they have difficulty regulating and how they impact their lives. Patients will be asked to identify healthy coping skills to combat unhealthy reactions to negative emotions.   Pt attended group and stayed the entire time. She sat quietly and listened to other group members.   Antawan Mchugh L Tyana Butzer MSW, LCSWA  06/23/2015, 5:00 PM

## 2015-06-23 NOTE — Progress Notes (Signed)
Recreation Therapy Notes  Date: 12.05.16 Time: 3:00 pm Location: Craft Room  Group Topic: Self-expression  Goal Area(s) Addresses:  Patient will be able to identify a color that represents each emotion. Patient will verbalize benefit of using art as a means of self-expression. Patient will verbalize one positive emotion experienced while participating in activity.  Behavioral Response: Attentive  Intervention: The Colors Within Me  Activity: Patients were given a blank face worksheet and instructed to analyze the emotions they are experiencing, pick a color for each emotion, and show on the worksheet how much of that emotion they are experiencing.  Education: LRT educated patients on different forms of self-expression.  Education Outcome: In group clarification offered   Clinical Observations/Feedback: Patient drew her face as a baby because she was happy then. Patient left group at aprpoximately 3:25 pm with social work. Patient returned to group at approximately 3:43 pm. Patient did not contribute to group discussion.  Jacquelynn CreeGreene,Nima Bamburg M, LRT/CTRS 06/23/2015 4:59 PM

## 2015-06-23 NOTE — BHH Group Notes (Signed)
BHH Group Notes:  (Nursing/MHT/Case Management/Adjunct)  Date:  06/23/2015  Time:  2:39 AM  Type of Therapy:  Group Therapy  Participation Level:  Active  Participation Quality:  Appropriate  Affect:  Appropriate  Cognitive:  Appropriate  Insight:  Improving  Engagement in Group:  Developing/Improving  Modes of Intervention:  n/a  Summary of Progress/Problems:  Jessica Watkins 06/23/2015, 2:39 AM

## 2015-06-23 NOTE — BHH Group Notes (Signed)
[facility]charge Planning Group Note   06/23/2015 11:15 AM  Participation Quality:  Patient attended and participated in group discussion sharing that her SMART goal is to "get discharged and check on things at home" as patient reports she is concerned about bills she needs to be home to pay.   Mood/Affect:  Blunted  Thoughts of Suicide:  No Will you contract for safety?   NA  Current AVH:  No  Plan for Discharge/Comments:  Patient will discharge home with mental health follow up and patient's daughter is involved with patient care  Transportation Means: patient's daughter will pick up at discharge  Supports: Patient's daughter is working to support patient and patient is interested in [facility] follow up   Lulu RidingIngle, Daryon Remmert T, MSW, [employer]CSWA
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
## 2015-06-23 NOTE — Plan of Care (Signed)
Problem: Alteration in thought process Goal: STG-Patient does not respond to command hallucinations Outcome: Progressing Patient not responding to internal stimuli.

## 2015-06-24 ENCOUNTER — Other Ambulatory Visit: Payer: Self-pay | Admitting: Psychiatry

## 2015-06-24 MED ORDER — CITALOPRAM HYDROBROMIDE 20 MG PO TABS
20.0000 mg | ORAL_TABLET | Freq: Every day | ORAL | Status: DC
Start: 2015-06-24 — End: 2015-06-24

## 2015-06-24 MED ORDER — QUETIAPINE FUMARATE 50 MG PO TABS: 50.0000 mg | ORAL_TABLET | Freq: Every day | ORAL | Status: DC

## 2015-06-24 MED ORDER — BENZTROPINE MESYLATE 1 MG PO TABS: 1.0000 mg | ORAL_TABLET | Freq: Two times a day (BID) | ORAL | Status: DC

## 2015-06-24 MED ORDER — HALOPERIDOL 5 MG PO TABS: 5.0000 mg | ORAL_TABLET | Freq: Three times a day (TID) | ORAL | Status: DC

## 2015-07-01 ENCOUNTER — Ambulatory Visit: Payer: Self-pay

## 2015-07-17 ENCOUNTER — Ambulatory Visit: Payer: No Typology Code available for payment source

## 2015-08-20 ENCOUNTER — Ambulatory Visit: Payer: Self-pay | Admitting: Internal Medicine

## 2015-08-27 ENCOUNTER — Ambulatory Visit: Payer: Self-pay | Admitting: Ophthalmology

## 2015-08-27 ENCOUNTER — Ambulatory Visit: Payer: Self-pay

## 2015-08-27 LAB — BASIC METABOLIC PANEL
BUN: 11 mg/dL (ref 4–21)
Creatinine: 0.8 mg/dL (ref 0.5–1.1)
Glucose: 95 mg/dL
Potassium: 4.1 mmol/L (ref 3.4–5.3)
SODIUM: 139 mmol/L (ref 137–147)

## 2015-08-27 LAB — TSH: TSH: 0.73 u[IU]/mL (ref 0.41–5.90)

## 2015-08-27 LAB — LIPID PANEL
Cholesterol: 216 mg/dL — AB (ref 0–200)
HDL: 61 mg/dL (ref 35–70)
LDL CALC: 135 mg/dL
TRIGLYCERIDES: 98 mg/dL (ref 40–160)

## 2015-08-27 LAB — CBC AND DIFFERENTIAL
HCT: 38 % (ref 36–46)
Hemoglobin: 13 g/dL (ref 12.0–16.0)
NEUTROS ABS: 4 /uL
Platelets: 304 10*3/uL (ref 150–399)
WBC: 6.5 10^3/mL

## 2015-08-27 LAB — HEPATIC FUNCTION PANEL
ALT: 70 U/L — AB (ref 7–35)
AST: 40 U/L — AB (ref 13–35)
Alkaline Phosphatase: 314 U/L — AB (ref 25–125)
BILIRUBIN, TOTAL: 0.6 mg/dL

## 2015-08-27 LAB — HEMOGLOBIN A1C: HEMOGLOBIN A1C: 5.6

## 2015-08-28 ENCOUNTER — Other Ambulatory Visit: Payer: Self-pay

## 2015-09-03 ENCOUNTER — Ambulatory Visit: Payer: Self-pay | Admitting: Internal Medicine

## 2015-09-03 ENCOUNTER — Other Ambulatory Visit: Payer: Self-pay | Admitting: Family Medicine

## 2015-09-03 DIAGNOSIS — R748 Abnormal levels of other serum enzymes: Secondary | ICD-10-CM | POA: Insufficient documentation

## 2015-09-03 DIAGNOSIS — B1920 Unspecified viral hepatitis C without hepatic coma: Secondary | ICD-10-CM | POA: Insufficient documentation

## 2015-09-03 DIAGNOSIS — R7303 Prediabetes: Secondary | ICD-10-CM | POA: Insufficient documentation

## 2015-09-10 ENCOUNTER — Ambulatory Visit: Payer: Self-pay | Admitting: Ophthalmology

## 2015-09-18 DIAGNOSIS — B1920 Unspecified viral hepatitis C without hepatic coma: Secondary | ICD-10-CM

## 2015-09-18 DIAGNOSIS — R748 Abnormal levels of other serum enzymes: Secondary | ICD-10-CM

## 2015-09-18 DIAGNOSIS — R7303 Prediabetes: Secondary | ICD-10-CM

## 2015-09-19 LAB — ALKALINE PHOSPHATASE, ISOENZYMES
Alkaline Phosphatase: 286 IU/L — ABNORMAL HIGH (ref 39–117)
BONE FRACTION: 23 % (ref 14–68)
INTESTINAL FRAC.: 5 % (ref 0–18)
LIVER FRACTION: 72 % (ref 18–85)

## 2015-09-19 LAB — SPECIMEN STATUS REPORT

## 2015-12-10 ENCOUNTER — Encounter: Payer: Self-pay | Admitting: Internal Medicine

## 2015-12-10 ENCOUNTER — Ambulatory Visit: Payer: Self-pay | Admitting: Internal Medicine

## 2015-12-10 VITALS — BP 105/71 | HR 90 | Temp 98.4°F | Wt 219.0 lb

## 2015-12-10 DIAGNOSIS — M25561 Pain in right knee: Secondary | ICD-10-CM

## 2015-12-10 DIAGNOSIS — M25569 Pain in unspecified knee: Secondary | ICD-10-CM | POA: Insufficient documentation

## 2015-12-10 DIAGNOSIS — R739 Hyperglycemia, unspecified: Secondary | ICD-10-CM

## 2015-12-10 DIAGNOSIS — R7303 Prediabetes: Secondary | ICD-10-CM

## 2015-12-10 LAB — GLUCOSE, POCT (MANUAL RESULT ENTRY): POC Glucose: 125 mg/dl — AB (ref 70–99)

## 2015-12-10 NOTE — Progress Notes (Signed)
   Subjective:    Patient ID: Jessica Watkins, female    DOB: 23-Mar-1966, 50 y.o.   MRN: [id number]  HPI  Patient Active Problem List   Diagnosis Date Noted  . Hepatitis C 09/03/2015  . Elevated alkaline phosphatase level 09/03/2015  . Prediabetes 09/03/2015  . Schizoaffective disorder, depressive type ([place]) 06/12/2015  . Obesity 06/12/2015  . Unspecified vitamin D deficiency 05/04/2012   Pt has had R leg pain for about a year. She has difficulty picking the leg up. Pt has not noticed any swelling.   Review of Systems     Objective:   Physical Exam  Constitutional: She is oriented to person, place, and time.  Cardiovascular: Normal rate, regular rhythm and normal heart sounds.   Pulmonary/Chest: Effort normal and breath sounds normal.  Musculoskeletal:  Stiffness in the R knee. No swelling  Neurological: She is alert and oriented to person, place, and time.    Outpatient Encounter Prescriptions as of 12/10/2015  Medication Sig  . benztropine (COGENTIN) 2 MG tablet Take 2 mg by mouth at bedtime.  . citalopram (CELEXA) 20 MG tablet Take 20 mg by mouth daily.  . haloperidol (HALDOL) 5 MG tablet Take 25 mg by mouth 3 (three) times daily.  . QUEtiapine (SEROQUEL) 100 MG tablet Take 100 mg by mouth at bedtime.   No facility-administered encounter medications on file as of 12/10/2015.    BP 105/71 mmHg  Pulse 90  Temp(Src) 98.4 F (36.9 C)  Wt 219 lb (99.338 kg)  LMP 04/29/2012  Blood glucose: 125     Assessment & Plan:  Labs today: Met C, CBC, lipid, uric acid, tsh, urinalysis MD f/u: 1 month  Jessica Watkins was seen today for knee pain.  Diagnoses and all orders for this visit:  Hyperglycemia -     POCT Glucose (CBG) -     Comp Met (CMET) -     Lipid panel; Future -     Uric acid -     TSH -     Urinalysis  Prediabetes -     CBC w/Diff  Right knee pain -     DG Knee Complete 4 Views Right; Future

## 2015-12-11 LAB — CBC WITH DIFFERENTIAL/PLATELET
BASOS: 0 %
Basophils Absolute: 0 10*3/uL (ref 0.0–0.2)
EOS (ABSOLUTE): 0.1 10*3/uL (ref 0.0–0.4)
EOS: 2 %
HEMATOCRIT: 38.9 % (ref 34.0–46.6)
HEMOGLOBIN: 13 g/dL (ref 11.1–15.9)
IMMATURE GRANS (ABS): 0 10*3/uL (ref 0.0–0.1)
IMMATURE GRANULOCYTES: 0 %
LYMPHS: 28 %
Lymphocytes Absolute: 1.8 10*3/uL (ref 0.7–3.1)
MCH: 29.6 pg (ref 26.6–33.0)
MCHC: 33.4 g/dL (ref 31.5–35.7)
MCV: 89 fL (ref 79–97)
MONOCYTES: 7 %
MONOS ABS: 0.4 10*3/uL (ref 0.1–0.9)
NEUTROS PCT: 63 %
Neutrophils Absolute: 3.9 10*3/uL (ref 1.4–7.0)
Platelets: 319 10*3/uL (ref 150–379)
RBC: 4.39 x10E6/uL (ref 3.77–5.28)
RDW: 14.2 % (ref 12.3–15.4)
WBC: 6.2 10*3/uL (ref 3.4–10.8)

## 2015-12-11 LAB — URINALYSIS
BILIRUBIN UA: NEGATIVE
Glucose, UA: NEGATIVE
KETONES UA: NEGATIVE
NITRITE UA: POSITIVE — AB
PH UA: 6 (ref 5.0–7.5)
Protein, UA: NEGATIVE
SPEC GRAV UA: 1.018 (ref 1.005–1.030)
Urobilinogen, Ur: 0.2 mg/dL (ref 0.2–1.0)

## 2015-12-11 LAB — COMPREHENSIVE METABOLIC PANEL
ALK PHOS: 269 IU/L — AB (ref 39–117)
ALT: 41 IU/L — AB (ref 0–32)
AST: 25 IU/L (ref 0–40)
Albumin/Globulin Ratio: 1.2 (ref 1.2–2.2)
Albumin: 4.2 g/dL (ref 3.5–5.5)
BUN/Creatinine Ratio: 15 (ref 9–23)
BUN: 13 mg/dL (ref 6–24)
Bilirubin Total: 0.5 mg/dL (ref 0.0–1.2)
CALCIUM: 9.5 mg/dL (ref 8.7–10.2)
CO2: 22 mmol/L (ref 18–29)
CREATININE: 0.89 mg/dL (ref 0.57–1.00)
Chloride: 101 mmol/L (ref 96–106)
GFR calc Af Amer: 87 mL/min/{1.73_m2} (ref >59)
GFR, EST NON AFRICAN AMERICAN: 75 mL/min/{1.73_m2} (ref >59)
GLOBULIN, TOTAL: 3.5 g/dL (ref 1.5–4.5)
GLUCOSE: 119 mg/dL — AB (ref 65–99)
Potassium: 4.1 mmol/L (ref 3.5–5.2)
Sodium: 142 mmol/L (ref 134–144)
Total Protein: 7.7 g/dL (ref 6.0–8.5)

## 2015-12-11 LAB — TSH: TSH: 0.65 u[IU]/mL (ref 0.450–4.500)

## 2015-12-11 LAB — URIC ACID: Uric Acid: 5.7 mg/dL (ref 2.5–7.1)

## 2015-12-12 ENCOUNTER — Ambulatory Visit
Admission: RE | Admit: 2015-12-12 | Discharge: 2015-12-12 | Disposition: A | Discharge status: Home / Self Care | Payer: Medicaid Other | Source: Ambulatory Visit | Attending: Internal Medicine | Admitting: Internal Medicine

## 2015-12-12 DIAGNOSIS — M25561 Pain in right knee: Secondary | ICD-10-CM | POA: Insufficient documentation

## 2015-12-12 DIAGNOSIS — M25461 Effusion, right knee: Secondary | ICD-10-CM | POA: Diagnosis not present

## 2016-01-13 DIAGNOSIS — R7689 Other specified abnormal immunological findings in serum: Secondary | ICD-10-CM

## 2016-01-13 HISTORY — DX: Other specified abnormal immunological findings in serum: R76.89

## 2016-01-14 ENCOUNTER — Ambulatory Visit: Payer: Self-pay | Admitting: Internal Medicine

## 2016-02-27 ENCOUNTER — Other Ambulatory Visit: Payer: Self-pay | Admitting: Internal Medicine

## 2016-02-27 DIAGNOSIS — Z1231 Encounter for screening mammogram for malignant neoplasm of breast: Secondary | ICD-10-CM

## 2016-03-08 ENCOUNTER — Telehealth: Payer: Self-pay

## 2016-03-08 NOTE — Telephone Encounter (Signed)
[facility] faxed to [facility] for [facility] of care.

## 2016-03-09 ENCOUNTER — Ambulatory Visit
Admission: RE | Admit: 2016-03-09 | Discharge: 2016-03-09 | Disposition: A | Discharge status: Home / Self Care | Payer: Medicaid Other | Source: Ambulatory Visit | Attending: Internal Medicine | Admitting: Internal Medicine

## 2016-03-09 ENCOUNTER — Encounter: Payer: Self-pay | Admitting: Radiology

## 2016-03-09 DIAGNOSIS — Z1231 Encounter for screening mammogram for malignant neoplasm of breast: Secondary | ICD-10-CM | POA: Diagnosis present

## 2016-04-15 DIAGNOSIS — R29898 Other symptoms and signs involving the musculoskeletal system: Secondary | ICD-10-CM | POA: Insufficient documentation

## 2016-04-15 HISTORY — DX: Other symptoms and signs involving the musculoskeletal system: R29.898

## 2016-08-26 DIAGNOSIS — M1711 Unilateral primary osteoarthritis, right knee: Secondary | ICD-10-CM | POA: Insufficient documentation

## 2016-09-07 ENCOUNTER — Other Ambulatory Visit: Payer: Self-pay | Admitting: Student

## 2016-09-07 DIAGNOSIS — Z205 Contact with and (suspected) exposure to viral hepatitis: Secondary | ICD-10-CM

## 2016-09-07 DIAGNOSIS — R748 Abnormal levels of other serum enzymes: Secondary | ICD-10-CM

## 2016-09-10 ENCOUNTER — Ambulatory Visit
Admission: RE | Admit: 2016-09-10 | Discharge: 2016-09-10 | Disposition: A | Discharge status: Home / Self Care | Payer: BLUE CROSS/BLUE SHIELD | Source: Ambulatory Visit | Attending: Student | Admitting: Student

## 2016-09-10 ENCOUNTER — Ambulatory Visit: Payer: BLUE CROSS/BLUE SHIELD | Admitting: Pharmacy Technician

## 2016-09-10 DIAGNOSIS — K769 Liver disease, unspecified: Secondary | ICD-10-CM | POA: Diagnosis not present

## 2016-09-10 DIAGNOSIS — Z205 Contact with and (suspected) exposure to viral hepatitis: Secondary | ICD-10-CM | POA: Diagnosis present

## 2016-09-10 DIAGNOSIS — R748 Abnormal levels of other serum enzymes: Secondary | ICD-10-CM | POA: Diagnosis present

## 2016-09-10 DIAGNOSIS — Z79899 Other long term (current) drug therapy: Secondary | ICD-10-CM

## 2016-09-10 DIAGNOSIS — Z9049 Acquired absence of other specified parts of digestive tract: Secondary | ICD-10-CM | POA: Diagnosis not present

## 2016-09-10 NOTE — Progress Notes (Signed)
Met with patient.  Patient indicated that she has signed-up for insurance with [employer] of [place] that will be effective 09/16/2016.  Explained to patient that [facility] would not be able to provide medication assistance beginning September 16, 2016.  Patient acknowledged that she understood.    According to Notice Award from [employer], patient will be eligible to sign-up for Medicare effective 02/16/2017.  Provided patient information regarding how to sign-up for Medicare parts A, B & D.  Also, provided patient with information about Medicare Savings Plan and L.I.S, as well as how to be screened for each program.  Patient to come on 01/10/17 to speak with Ellie Lunch at [facility] for a Medicare Consult.    Provided patient with community resource material based on her particular needs.    [place] [facility]

## 2016-09-20 ENCOUNTER — Ambulatory Visit: Payer: Self-pay

## 2016-10-07 ENCOUNTER — Other Ambulatory Visit: Payer: Self-pay | Admitting: Student

## 2016-10-07 DIAGNOSIS — R748 Abnormal levels of other serum enzymes: Secondary | ICD-10-CM

## 2016-10-15 ENCOUNTER — Other Ambulatory Visit: Payer: Self-pay | Admitting: Radiology

## 2016-10-18 ENCOUNTER — Other Ambulatory Visit
Admission: RE | Admit: 2016-10-18 | Discharge: 2016-10-18 | Disposition: A | Discharge status: Home / Self Care | Payer: BLUE CROSS/BLUE SHIELD | Source: Ambulatory Visit | Attending: Student | Admitting: Student

## 2016-10-18 DIAGNOSIS — R748 Abnormal levels of other serum enzymes: Secondary | ICD-10-CM | POA: Diagnosis not present

## 2016-10-18 DIAGNOSIS — R932 Abnormal findings on diagnostic imaging of liver and biliary tract: Secondary | ICD-10-CM | POA: Diagnosis present

## 2016-10-18 LAB — APTT: APTT: 31 s (ref 24–36)

## 2016-10-19 ENCOUNTER — Ambulatory Visit
Admission: RE | Admit: 2016-10-19 | Discharge: 2016-10-19 | Disposition: A | Discharge status: Home / Self Care | Payer: BLUE CROSS/BLUE SHIELD | Source: Ambulatory Visit | Attending: Student | Admitting: Student

## 2016-10-19 DIAGNOSIS — R748 Abnormal levels of other serum enzymes: Secondary | ICD-10-CM | POA: Diagnosis present

## 2016-10-19 DIAGNOSIS — R932 Abnormal findings on diagnostic imaging of liver and biliary tract: Secondary | ICD-10-CM | POA: Insufficient documentation

## 2016-10-19 LAB — CBC
HEMATOCRIT: 37.5 % (ref 35.0–47.0)
HEMOGLOBIN: 13 g/dL (ref 12.0–16.0)
MCH: 30.1 pg (ref 26.0–34.0)
MCHC: 34.7 g/dL (ref 32.0–36.0)
MCV: 86.7 fL (ref 80.0–100.0)
Platelets: 283 10*3/uL (ref 150–440)
RBC: 4.32 MIL/uL (ref 3.80–5.20)
RDW: 13.4 % (ref 11.5–14.5)
WBC: 6.3 10*3/uL (ref 3.6–11.0)

## 2016-10-19 MED ORDER — SODIUM CHLORIDE 0.9 % IV SOLN
INTRAVENOUS | Status: DC
  Administered 2016-10-19: 10:00:00 via INTRAVENOUS

## 2016-10-19 MED ORDER — MIDAZOLAM HCL 2 MG/2ML IJ SOLN
INTRAMUSCULAR | Status: AC | PRN
  Administered 2016-10-19: 1 mg via INTRAVENOUS

## 2016-10-19 MED ORDER — HYDROCODONE-ACETAMINOPHEN 5-325 MG PO TABS
1.0000 | ORAL_TABLET | ORAL | Status: DC | PRN
  Filled 2016-10-19: qty 2

## 2016-10-19 MED ORDER — FENTANYL CITRATE (PF) 100 MCG/2ML IJ SOLN
INTRAMUSCULAR | Status: AC | PRN
  Administered 2016-10-19: 50 ug via INTRAVENOUS

## 2016-10-19 NOTE — Procedures (Signed)
Procedure and risks discussed with patient. Informed consent obtained. Will perform US-guided liver biopsy. 

## 2016-10-19 NOTE — Procedures (Signed)
Under US guidance, biopsy of right hepatic lobe was performed. No immediate complication. 

## 2016-10-19 NOTE — Discharge Instructions (Signed)
Liver Biopsy, Care After  Refer to this sheet in the next few weeks. These instructions provide you with information on caring for yourself after your procedure. Your health care provider may also give you more specific instructions. Your treatment has been planned according to current medical practices, but problems sometimes occur. Call your health care provider if you have any problems or questions after your procedure.  What can I expect after the procedure?  After your procedure, it is typical to have the following:  · A small amount of discomfort in the area where the biopsy was done and in the right shoulder or shoulder blade.  · A small amount of bruising around the area where the biopsy was done and on the skin over the liver.  · Sleepiness and fatigue for the rest of the day.    Follow these instructions at home:  · Rest at home for 1-2 days or as directed by your health care provider.  · Have a friend or family member stay with you for at least 24 hours.  · Because of the medicines used during the procedure, you should not do the following things in the first 24 hours:  ? Drive.  ? Use machinery.  ? Be responsible for the care of other people.  ? Sign legal documents.  ? Take a bath or shower.  · There are many different ways to close and cover an incision, including stitches, skin glue, and adhesive strips. Follow your health care provider's instructions on:  ? Incision care.  ? Bandage (dressing) changes and removal.  ? Incision closure removal.  · Do not drink alcohol in the first week.  · Do not lift more than 5 pounds or play contact sports for 2 weeks after this test.  · Take medicines only as directed by your health care provider. Do not take medicine containing aspirin or non-steroidal anti-inflammatory medicines such as ibuprofen for 1 week after this test.  · It is your responsibility to get your test results.  Contact a health care provider if:  · You have increased bleeding from an incision  that results in more than a small spot of blood.  · You have redness, swelling, or increasing pain in any incisions.  · You notice a discharge or a bad smell coming from any of your incisions.  · You have a fever or chills.  Get help right away if:  · You develop swelling, bloating, or pain in your abdomen.  · You become dizzy or faint.  · You develop a rash.  · You are nauseous or vomit.  · You have difficulty breathing, feel short of breath, or feel faint.  · You develop chest pain.  · You have problems with your speech or vision.  · You have trouble balancing or moving your arms or legs.  This information is not intended to replace advice given to you by your health care provider. Make sure you discuss any questions you have with your health care provider.  Document Released: 01/22/2005 Document Revised: 12/11/2015 Document Reviewed: 08/31/2013  Elsevier Interactive Patient Education © 2017 Elsevier Inc.

## 2016-10-26 LAB — SURGICAL PATHOLOGY

## 2017-01-03 ENCOUNTER — Ambulatory Visit: Payer: BLUE CROSS/BLUE SHIELD | Admitting: Pharmacist

## 2017-01-03 ENCOUNTER — Encounter (INDEPENDENT_AMBULATORY_CARE_PROVIDER_SITE_OTHER): Payer: Self-pay

## 2017-01-03 DIAGNOSIS — Z79899 Other long term (current) drug therapy: Secondary | ICD-10-CM

## 2017-01-03 NOTE — Patient Instructions (Signed)
Call Social Security to find out when your Medicare goes into effect and when you will receive your red/white/blue Medicare card. [phone].  You will need to be screened for the Low Income Subsidy which may reduce your prescription co-pays and reduce the annual medication deductible.  Pharmacist to inquire about deferring Medicare Part B and D if you continue with the [employer] insurance. Need to make sure you do not incur any penalties and that the affordable care coverage [facility](BCBS) can continue once you are eligible for Medicare.

## 2017-01-04 NOTE — Progress Notes (Signed)
[facility]ication Management Clinic Visit Note  Patient: Jessica Watkins MRN: [id number] Date of Birth: 04-13-1966 PCP: System, Pcp Not In   Jessica Watkins 51 y.o. female presents for a Medicare consult visit today. She was deemed disabled August 2016 and is coming up on the 2-year mark which qualifies her for Medicare coverage.  LMP 04/29/2012   Patient Information   Past Medical History:  Diagnosis Date  . Allergy    RHINITIS  . Depression    sees psychiatrist in [place]  . Dyslipidemia   . GERD (gastroesophageal reflux disease)   . Hepatitis C   . Migraines       Past Surgical History:  Procedure Laterality Date  . CHOLECYSTECTOMY  1997  . TUBAL LIGATION       Family History  Problem Relation Age of Onset  . COPD Mother   . Cirrhosis Mother   . Kidney disease Father   . Gout Father   . Heart disease Father        CHF  . Diabetes Father   . Diabetes Maternal Grandmother   . Diabetes Paternal Grandmother   . Cancer Neg Hx   . Breast cancer Neg Hx        Current Exercise Habits: The patient does not participate in regular exercise at present       History  Alcohol Use No      History  Smoking Status  . Never Smoker  Smokeless Tobacco  . Never Used    Comment: passive exposure (from son)      Health Maintenance  Topic Date Due  . HIV Screening  11/20/1979  . COLONOSCOPY  11/20/2014  . PAP SMEAR  05/05/2015  . INFLUENZA VACCINE  02/16/2017  . MAMMOGRAM  03/09/2018  . TETANUS/TDAP  04/10/2022   Outpatient Encounter Prescriptions as of 01/03/2017  Medication Sig  . benztropine (COGENTIN) 1 MG tablet Take 3 mg by mouth 2 (two) times daily.  . citalopram (CELEXA) 20 MG tablet Take 20 mg by mouth daily.  . haloperidol decanoate (HALDOL DECANOATE) 100 MG/ML injection Inject into the muscle every 28 (twenty-eight) days. Given to patient at [facility]; dose not confirmed  . meloxicam (MOBIC) 7.5 MG tablet Take 7.5 mg by mouth as needed for pain  (daily as needed for joint pain).  . pantoprazole (PROTONIX) 40 MG tablet Take 40 mg by mouth daily. Take on an empty stomach  . QUEtiapine (SEROQUEL) 100 MG tablet Take 250 mg by mouth at bedtime.   . [DISCONTINUED] benztropine (COGENTIN) 2 MG tablet Take 2 mg by mouth at bedtime.  . [DISCONTINUED] haloperidol (HALDOL) 5 MG tablet Take 25 mg by mouth 3 (three) times daily.   No facility-administered encounter medications on file as of 01/03/2017.     Assessment and Plan:  Medicare Status: Jessica Watkins has not received her Medicare card in the mail. I attempted to call Social Security [facility]) but was placed on hold for 45 minutes and not able to speak with a representative. Jessica Watkins was given the number to SS to call and find out when her Medicare goes into effect and when she should expect to receive her card. She may qualify for partial low-income subsidy (LIS) to assist with her prescription co-pays, but will be responsible for the monthly Part B premium of $134. Patient inquired about keeping her current BCBS coverage and what the penalty might be for deferring Medicare Part B and Part D.  Current Insurance Status: Currently she  is covered through the Affordable Care Act (ACA) with BCBS and pays a $56 monthly premium.  Primary/Specialist: $5/$20. Drug Tier 1-4: $4/$10/$20/$80. Urgent Care/ER: $20/$500 (after deductible)  Outcomes: Patient to call SS and find out when her Medicare goes into affect. Patient will follow up with [facility] to review Medicare Part D and Advantage Plans. Patient [facility].    Yvan Dority K. Joelene MillinHarrison, BS, PharmD [facility] Clinic-Pharmacy Operations Coordinator [phone]

## 2017-01-10 ENCOUNTER — Encounter: Payer: Self-pay | Admitting: Pharmacist

## 2017-02-01 ENCOUNTER — Other Ambulatory Visit: Payer: Self-pay | Admitting: Internal Medicine

## 2017-02-01 DIAGNOSIS — Z1231 Encounter for screening mammogram for malignant neoplasm of breast: Secondary | ICD-10-CM

## 2017-03-10 ENCOUNTER — Ambulatory Visit
Admission: RE | Admit: 2017-03-10 | Discharge: 2017-03-10 | Disposition: A | Discharge status: Home / Self Care | Payer: BLUE CROSS/BLUE SHIELD | Source: Ambulatory Visit | Attending: Internal Medicine | Admitting: Internal Medicine

## 2017-03-10 DIAGNOSIS — Z1231 Encounter for screening mammogram for malignant neoplasm of breast: Secondary | ICD-10-CM

## 2017-05-27 DIAGNOSIS — R87610 Atypical squamous cells of undetermined significance on cytologic smear of cervix (ASC-US): Secondary | ICD-10-CM | POA: Insufficient documentation

## 2017-05-29 DIAGNOSIS — Z780 Asymptomatic menopausal state: Secondary | ICD-10-CM | POA: Insufficient documentation

## 2017-05-29 HISTORY — DX: Asymptomatic menopausal state: Z78.0

## 2017-09-28 DIAGNOSIS — R7303 Prediabetes: Secondary | ICD-10-CM | POA: Diagnosis not present

## 2017-09-28 DIAGNOSIS — K7589 Other specified inflammatory liver diseases: Secondary | ICD-10-CM | POA: Diagnosis not present

## 2017-09-28 DIAGNOSIS — Z1159 Encounter for screening for other viral diseases: Secondary | ICD-10-CM | POA: Diagnosis not present

## 2017-09-28 DIAGNOSIS — R0602 Shortness of breath: Secondary | ICD-10-CM | POA: Diagnosis not present

## 2017-09-28 DIAGNOSIS — R945 Abnormal results of liver function studies: Secondary | ICD-10-CM | POA: Diagnosis not present

## 2017-09-28 DIAGNOSIS — R5383 Other fatigue: Secondary | ICD-10-CM | POA: Diagnosis not present

## 2017-09-28 DIAGNOSIS — Z880 Allergy status to penicillin: Secondary | ICD-10-CM | POA: Diagnosis not present

## 2017-09-28 DIAGNOSIS — F329 Major depressive disorder, single episode, unspecified: Secondary | ICD-10-CM | POA: Diagnosis not present

## 2017-09-28 DIAGNOSIS — Z79899 Other long term (current) drug therapy: Secondary | ICD-10-CM | POA: Diagnosis not present

## 2017-09-28 DIAGNOSIS — Z9049 Acquired absence of other specified parts of digestive tract: Secondary | ICD-10-CM | POA: Diagnosis not present

## 2017-09-28 DIAGNOSIS — K739 Chronic hepatitis, unspecified: Secondary | ICD-10-CM | POA: Diagnosis not present

## 2017-09-28 DIAGNOSIS — Z7984 Long term (current) use of oral hypoglycemic drugs: Secondary | ICD-10-CM | POA: Diagnosis not present

## 2017-09-28 DIAGNOSIS — R932 Abnormal findings on diagnostic imaging of liver and biliary tract: Secondary | ICD-10-CM | POA: Diagnosis not present

## 2017-09-28 DIAGNOSIS — R749 Abnormal serum enzyme level, unspecified: Secondary | ICD-10-CM | POA: Diagnosis not present

## 2017-10-10 DIAGNOSIS — K759 Inflammatory liver disease, unspecified: Secondary | ICD-10-CM | POA: Diagnosis not present

## 2018-01-09 DIAGNOSIS — R7303 Prediabetes: Secondary | ICD-10-CM | POA: Diagnosis not present

## 2018-01-09 DIAGNOSIS — E785 Hyperlipidemia, unspecified: Secondary | ICD-10-CM | POA: Diagnosis not present

## 2018-01-09 DIAGNOSIS — K743 Primary biliary cirrhosis: Secondary | ICD-10-CM | POA: Diagnosis not present

## 2018-01-09 DIAGNOSIS — K219 Gastro-esophageal reflux disease without esophagitis: Secondary | ICD-10-CM | POA: Diagnosis not present

## 2018-01-11 DIAGNOSIS — R7303 Prediabetes: Secondary | ICD-10-CM | POA: Diagnosis not present

## 2018-01-11 DIAGNOSIS — K743 Primary biliary cirrhosis: Secondary | ICD-10-CM | POA: Diagnosis not present

## 2018-01-11 DIAGNOSIS — K740 Hepatic fibrosis: Secondary | ICD-10-CM | POA: Diagnosis not present

## 2018-01-11 DIAGNOSIS — K738 Other chronic hepatitis, not elsewhere classified: Secondary | ICD-10-CM | POA: Diagnosis not present

## 2018-01-11 DIAGNOSIS — Z23 Encounter for immunization: Secondary | ICD-10-CM | POA: Diagnosis not present

## 2018-01-11 DIAGNOSIS — E509 Vitamin A deficiency, unspecified: Secondary | ICD-10-CM | POA: Diagnosis not present

## 2018-01-11 DIAGNOSIS — E559 Vitamin D deficiency, unspecified: Secondary | ICD-10-CM | POA: Diagnosis not present

## 2018-01-11 DIAGNOSIS — R748 Abnormal levels of other serum enzymes: Secondary | ICD-10-CM | POA: Diagnosis not present

## 2018-01-11 DIAGNOSIS — E669 Obesity, unspecified: Secondary | ICD-10-CM | POA: Diagnosis not present

## 2018-02-02 DIAGNOSIS — H524 Presbyopia: Secondary | ICD-10-CM | POA: Diagnosis not present

## 2018-02-02 DIAGNOSIS — Z0100 Encounter for examination of eyes and vision without abnormal findings: Secondary | ICD-10-CM | POA: Diagnosis not present

## 2018-02-02 DIAGNOSIS — E1165 Type 2 diabetes mellitus with hyperglycemia: Secondary | ICD-10-CM | POA: Diagnosis not present

## 2018-02-09 ENCOUNTER — Other Ambulatory Visit: Payer: Self-pay | Admitting: Internal Medicine

## 2018-02-09 DIAGNOSIS — Z1231 Encounter for screening mammogram for malignant neoplasm of breast: Secondary | ICD-10-CM

## 2018-02-15 DIAGNOSIS — Z8639 Personal history of other endocrine, nutritional and metabolic disease: Secondary | ICD-10-CM | POA: Diagnosis not present

## 2018-02-15 DIAGNOSIS — E559 Vitamin D deficiency, unspecified: Secondary | ICD-10-CM | POA: Diagnosis not present

## 2018-03-13 ENCOUNTER — Ambulatory Visit
Admission: RE | Admit: 2018-03-13 | Discharge: 2018-03-13 | Disposition: A | Discharge status: Home / Self Care | Payer: Medicare HMO | Source: Ambulatory Visit | Attending: Internal Medicine | Admitting: Internal Medicine

## 2018-03-13 DIAGNOSIS — Z1231 Encounter for screening mammogram for malignant neoplasm of breast: Secondary | ICD-10-CM

## 2018-04-17 DIAGNOSIS — Z79899 Other long term (current) drug therapy: Secondary | ICD-10-CM | POA: Diagnosis not present

## 2018-04-17 DIAGNOSIS — K219 Gastro-esophageal reflux disease without esophagitis: Secondary | ICD-10-CM | POA: Diagnosis not present

## 2018-04-17 DIAGNOSIS — B189 Chronic viral hepatitis, unspecified: Secondary | ICD-10-CM | POA: Diagnosis not present

## 2018-04-17 DIAGNOSIS — K743 Primary biliary cirrhosis: Secondary | ICD-10-CM | POA: Diagnosis not present

## 2018-04-17 DIAGNOSIS — N958 Other specified menopausal and perimenopausal disorders: Secondary | ICD-10-CM | POA: Diagnosis not present

## 2018-04-17 DIAGNOSIS — Z880 Allergy status to penicillin: Secondary | ICD-10-CM | POA: Diagnosis not present

## 2018-04-17 DIAGNOSIS — Z23 Encounter for immunization: Secondary | ICD-10-CM | POA: Diagnosis not present

## 2018-07-17 DIAGNOSIS — R748 Abnormal levels of other serum enzymes: Secondary | ICD-10-CM | POA: Diagnosis not present

## 2018-07-17 DIAGNOSIS — K7589 Other specified inflammatory liver diseases: Secondary | ICD-10-CM | POA: Diagnosis not present

## 2018-07-17 DIAGNOSIS — E669 Obesity, unspecified: Secondary | ICD-10-CM | POA: Diagnosis not present

## 2018-07-17 DIAGNOSIS — K743 Primary biliary cirrhosis: Secondary | ICD-10-CM | POA: Diagnosis not present

## 2018-07-17 DIAGNOSIS — F251 Schizoaffective disorder, depressive type: Secondary | ICD-10-CM | POA: Diagnosis not present

## 2018-07-17 DIAGNOSIS — Z23 Encounter for immunization: Secondary | ICD-10-CM | POA: Diagnosis not present

## 2018-07-17 DIAGNOSIS — K219 Gastro-esophageal reflux disease without esophagitis: Secondary | ICD-10-CM | POA: Diagnosis not present

## 2018-07-17 DIAGNOSIS — K740 Hepatic fibrosis: Secondary | ICD-10-CM | POA: Diagnosis not present

## 2018-07-17 DIAGNOSIS — Z780 Asymptomatic menopausal state: Secondary | ICD-10-CM | POA: Diagnosis not present

## 2018-07-17 DIAGNOSIS — M8588 Other specified disorders of bone density and structure, other site: Secondary | ICD-10-CM | POA: Diagnosis not present

## 2018-12-24 ENCOUNTER — Other Ambulatory Visit (HOSPITAL_COMMUNITY): Payer: Self-pay | Admitting: Psychiatry

## 2019-01-07 ENCOUNTER — Other Ambulatory Visit (HOSPITAL_COMMUNITY): Payer: Self-pay | Admitting: Psychiatry

## 2019-02-02 ENCOUNTER — Other Ambulatory Visit: Payer: Self-pay | Admitting: Internal Medicine

## 2019-02-02 DIAGNOSIS — Z1231 Encounter for screening mammogram for malignant neoplasm of breast: Secondary | ICD-10-CM

## 2019-03-07 ENCOUNTER — Other Ambulatory Visit (HOSPITAL_COMMUNITY): Payer: Self-pay | Admitting: Psychiatry

## 2019-04-17 ENCOUNTER — Ambulatory Visit
Admission: RE | Admit: 2019-04-17 | Discharge: 2019-04-17 | Disposition: A | Discharge status: Home / Self Care | Payer: Medicare HMO | Source: Ambulatory Visit | Attending: Internal Medicine | Admitting: Internal Medicine

## 2019-04-17 DIAGNOSIS — Z1231 Encounter for screening mammogram for malignant neoplasm of breast: Secondary | ICD-10-CM | POA: Insufficient documentation

## 2019-08-20 DIAGNOSIS — U071 COVID-19: Secondary | ICD-10-CM | POA: Diagnosis not present

## 2019-08-20 DIAGNOSIS — Z20828 Contact with and (suspected) exposure to other viral communicable diseases: Secondary | ICD-10-CM | POA: Diagnosis not present

## 2019-08-27 DIAGNOSIS — Z20828 Contact with and (suspected) exposure to other viral communicable diseases: Secondary | ICD-10-CM | POA: Diagnosis not present

## 2019-08-27 DIAGNOSIS — U071 COVID-19: Secondary | ICD-10-CM | POA: Diagnosis not present

## 2019-09-01 ENCOUNTER — Other Ambulatory Visit (HOSPITAL_COMMUNITY): Payer: Self-pay | Admitting: Psychiatry

## 2019-09-03 DIAGNOSIS — Z20828 Contact with and (suspected) exposure to other viral communicable diseases: Secondary | ICD-10-CM | POA: Diagnosis not present

## 2019-09-03 DIAGNOSIS — U071 COVID-19: Secondary | ICD-10-CM | POA: Diagnosis not present

## 2019-09-10 ENCOUNTER — Other Ambulatory Visit (HOSPITAL_COMMUNITY): Payer: Self-pay | Admitting: Psychiatry

## 2019-09-10 DIAGNOSIS — U071 COVID-19: Secondary | ICD-10-CM | POA: Diagnosis not present

## 2019-09-10 DIAGNOSIS — Z20828 Contact with and (suspected) exposure to other viral communicable diseases: Secondary | ICD-10-CM | POA: Diagnosis not present

## 2019-09-17 DIAGNOSIS — Z20828 Contact with and (suspected) exposure to other viral communicable diseases: Secondary | ICD-10-CM | POA: Diagnosis not present

## 2019-09-17 DIAGNOSIS — U071 COVID-19: Secondary | ICD-10-CM | POA: Diagnosis not present

## 2019-09-20 ENCOUNTER — Other Ambulatory Visit (HOSPITAL_COMMUNITY): Payer: Self-pay | Admitting: Psychiatry

## 2019-09-24 DIAGNOSIS — Z20828 Contact with and (suspected) exposure to other viral communicable diseases: Secondary | ICD-10-CM | POA: Diagnosis not present

## 2019-09-24 DIAGNOSIS — U071 COVID-19: Secondary | ICD-10-CM | POA: Diagnosis not present

## 2019-10-01 DIAGNOSIS — U071 COVID-19: Secondary | ICD-10-CM | POA: Diagnosis not present

## 2019-10-01 DIAGNOSIS — Z20828 Contact with and (suspected) exposure to other viral communicable diseases: Secondary | ICD-10-CM | POA: Diagnosis not present

## 2019-10-10 DIAGNOSIS — U071 COVID-19: Secondary | ICD-10-CM | POA: Diagnosis not present

## 2019-10-17 ENCOUNTER — Other Ambulatory Visit (HOSPITAL_COMMUNITY): Payer: Self-pay | Admitting: Psychiatry

## 2019-10-29 DIAGNOSIS — H35363 Drusen (degenerative) of macula, bilateral: Secondary | ICD-10-CM | POA: Diagnosis not present

## 2020-02-08 DIAGNOSIS — R7303 Prediabetes: Secondary | ICD-10-CM | POA: Diagnosis not present

## 2020-02-15 DIAGNOSIS — Z6841 Body Mass Index (BMI) 40.0 and over, adult: Secondary | ICD-10-CM | POA: Diagnosis not present

## 2020-02-15 DIAGNOSIS — K743 Primary biliary cirrhosis: Secondary | ICD-10-CM | POA: Diagnosis not present

## 2020-02-15 DIAGNOSIS — R7303 Prediabetes: Secondary | ICD-10-CM | POA: Diagnosis not present

## 2020-02-15 DIAGNOSIS — E669 Obesity, unspecified: Secondary | ICD-10-CM | POA: Diagnosis not present

## 2020-02-15 DIAGNOSIS — F259 Schizoaffective disorder, unspecified: Secondary | ICD-10-CM | POA: Diagnosis not present

## 2020-02-15 DIAGNOSIS — Z0000 Encounter for general adult medical examination without abnormal findings: Secondary | ICD-10-CM | POA: Diagnosis not present

## 2020-03-10 ENCOUNTER — Ambulatory Visit: Payer: No Typology Code available for payment source | Attending: Internal Medicine

## 2020-03-10 DIAGNOSIS — Z23 Encounter for immunization: Secondary | ICD-10-CM

## 2020-03-10 NOTE — Progress Notes (Signed)
   Covid-19 Vaccination Clinic  Name:  Jessica Watkins    MRN: [id number] DOB: 1966/04/17  03/10/2020  [name] was observed post Covid-19 immunization for 15 minutes without incident. She was provided with Vaccine Information Sheet and instruction to access the V-Safe system.   [name] was instructed to call 911 with any severe reactions post vaccine: Marland Kitchen Difficulty breathing  . Swelling of face and throat  . A fast heartbeat  . A bad rash all over body  . Dizziness and weakness   Immunizations Administered    Name Date Dose VIS Date Route   Pfizer COVID-19 Vaccine 03/10/2020  2:07 PM 0.3 mL 09/12/2018 Intramuscular   Manufacturer: [employer], [employer]   Lot: [id number]   NDC: [address]-4

## 2020-03-31 ENCOUNTER — Ambulatory Visit: Payer: Self-pay

## 2020-03-31 ENCOUNTER — Ambulatory Visit: Payer: No Typology Code available for payment source | Attending: Critical Care Medicine

## 2020-03-31 DIAGNOSIS — Z23 Encounter for immunization: Secondary | ICD-10-CM

## 2020-03-31 NOTE — Progress Notes (Signed)
   Covid-19 Vaccination Clinic  Name:  Jessica Watkins    MRN: [id number] DOB: September 05, 1965  03/31/2020  Jessica Watkins was observed post Covid-19 immunization for 15 minutes without incident. She was provided with Vaccine Information Sheet and instruction to access the V-Safe system.   Jessica Watkins was instructed to call 911 with any severe reactions post vaccine: Marland Kitchen Difficulty breathing  . Swelling of face and throat  . A fast heartbeat  . A bad rash all over body  . Dizziness and weakness   Immunizations Administered    Name Date Dose VIS Date Route   Pfizer COVID-19 Vaccine 03/31/2020  2:35 PM 0.3 mL 09/12/2018 Intramuscular   Manufacturer: [employer], [employer]   Lot: [id number]   NDC: [address]-5

## 2020-04-09 ENCOUNTER — Other Ambulatory Visit: Payer: Self-pay | Admitting: Internal Medicine

## 2020-04-09 DIAGNOSIS — Z1231 Encounter for screening mammogram for malignant neoplasm of breast: Secondary | ICD-10-CM

## 2020-04-29 ENCOUNTER — Other Ambulatory Visit: Payer: Self-pay

## 2020-04-29 ENCOUNTER — Ambulatory Visit
Admission: RE | Admit: 2020-04-29 | Discharge: 2020-04-29 | Disposition: A | Discharge status: Home / Self Care | Payer: Medicare HMO | Source: Ambulatory Visit | Attending: Internal Medicine | Admitting: Internal Medicine

## 2020-04-29 DIAGNOSIS — Z1231 Encounter for screening mammogram for malignant neoplasm of breast: Secondary | ICD-10-CM | POA: Insufficient documentation

## 2020-08-15 ENCOUNTER — Other Ambulatory Visit (HOSPITAL_COMMUNITY): Payer: Self-pay | Admitting: Psychiatry

## 2020-09-19 LAB — COLOGUARD: COLOGUARD: NEGATIVE

## 2021-01-16 ENCOUNTER — Other Ambulatory Visit (HOSPITAL_COMMUNITY): Payer: Self-pay | Admitting: Psychiatry

## 2021-02-05 DIAGNOSIS — Z5181 Encounter for therapeutic drug level monitoring: Secondary | ICD-10-CM | POA: Diagnosis not present

## 2021-02-05 DIAGNOSIS — G259 Extrapyramidal and movement disorder, unspecified: Secondary | ICD-10-CM | POA: Diagnosis not present

## 2021-02-05 DIAGNOSIS — F3176 Bipolar disorder, in full remission, most recent episode depressed: Secondary | ICD-10-CM | POA: Diagnosis not present

## 2021-02-05 DIAGNOSIS — Z79899 Other long term (current) drug therapy: Secondary | ICD-10-CM | POA: Diagnosis not present

## 2021-03-25 ENCOUNTER — Other Ambulatory Visit (HOSPITAL_COMMUNITY): Payer: Self-pay | Admitting: Psychiatry

## 2021-06-15 ENCOUNTER — Other Ambulatory Visit: Payer: Self-pay | Admitting: Internal Medicine

## 2021-06-15 DIAGNOSIS — Z1231 Encounter for screening mammogram for malignant neoplasm of breast: Secondary | ICD-10-CM

## 2021-07-07 DIAGNOSIS — E119 Type 2 diabetes mellitus without complications: Secondary | ICD-10-CM | POA: Insufficient documentation

## 2021-07-07 DIAGNOSIS — E118 Type 2 diabetes mellitus with unspecified complications: Secondary | ICD-10-CM | POA: Insufficient documentation

## 2021-08-18 ENCOUNTER — Ambulatory Visit
Admission: RE | Admit: 2021-08-18 | Discharge: 2021-08-18 | Disposition: A | Discharge status: Home / Self Care | Payer: Medicare (Managed Care) | Source: Ambulatory Visit | Attending: Internal Medicine | Admitting: Internal Medicine

## 2021-08-18 ENCOUNTER — Other Ambulatory Visit: Payer: Self-pay

## 2021-08-18 DIAGNOSIS — Z1231 Encounter for screening mammogram for malignant neoplasm of breast: Secondary | ICD-10-CM | POA: Insufficient documentation

## 2021-10-03 ENCOUNTER — Other Ambulatory Visit (HOSPITAL_COMMUNITY): Payer: Self-pay | Admitting: Psychiatry

## 2021-10-16 ENCOUNTER — Emergency Department: Payer: Medicare (Managed Care)

## 2021-10-16 ENCOUNTER — Emergency Department
Admission: EM | Admit: 2021-10-16 | Discharge: 2021-10-16 | Disposition: A | Discharge status: Home / Self Care | Payer: Medicare (Managed Care) | Attending: Emergency Medicine | Admitting: Emergency Medicine

## 2021-10-16 ENCOUNTER — Encounter: Payer: Self-pay | Admitting: Emergency Medicine

## 2021-10-16 ENCOUNTER — Other Ambulatory Visit: Payer: Self-pay

## 2021-10-16 DIAGNOSIS — S20211A Contusion of right front wall of thorax, initial encounter: Secondary | ICD-10-CM | POA: Diagnosis not present

## 2021-10-16 DIAGNOSIS — S161XXA Strain of muscle, fascia and tendon at neck level, initial encounter: Secondary | ICD-10-CM | POA: Diagnosis not present

## 2021-10-16 DIAGNOSIS — Y92410 Unspecified street and highway as the place of occurrence of the external cause: Secondary | ICD-10-CM | POA: Diagnosis not present

## 2021-10-16 DIAGNOSIS — S1091XA Abrasion of unspecified part of neck, initial encounter: Secondary | ICD-10-CM

## 2021-10-16 DIAGNOSIS — S199XXA Unspecified injury of neck, initial encounter: Secondary | ICD-10-CM | POA: Diagnosis present
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
## 2021-10-16 NOTE — Discharge Instructions (Signed)
Follow-up with your primary care provider if any continued problems or concerns.  You will be sore for approximately 4 to 5 days and also in muscles that are not currently sore.  Try to move about frequently to avoid stiffness.  You may use ice or heat to your muscles as needed for comfort.  Take Tylenol or ibuprofen as needed for discomfort.  If any severe worsening of your symptoms return to the emergency department.  X-rays of your rib and neck were negative for fracture. ?

## 2021-10-17 ENCOUNTER — Other Ambulatory Visit (HOSPITAL_COMMUNITY): Payer: Self-pay | Admitting: Psychiatry

## 2021-10-19 ENCOUNTER — Other Ambulatory Visit (HOSPITAL_COMMUNITY): Payer: Self-pay | Admitting: Psychiatry

## 2021-10-28 ENCOUNTER — Other Ambulatory Visit (HOSPITAL_COMMUNITY): Payer: Self-pay | Admitting: Psychiatry

## 2021-11-02 ENCOUNTER — Other Ambulatory Visit (HOSPITAL_COMMUNITY): Payer: Self-pay | Admitting: Psychiatry

## 2022-04-12 ENCOUNTER — Other Ambulatory Visit: Payer: Self-pay | Admitting: Adult Health Nurse Practitioner

## 2022-04-12 DIAGNOSIS — K743 Primary biliary cirrhosis: Secondary | ICD-10-CM

## 2022-04-19 ENCOUNTER — Ambulatory Visit
Admission: RE | Admit: 2022-04-19 | Discharge: 2022-04-19 | Disposition: A | Discharge status: Home / Self Care | Payer: Medicare PPO | Source: Ambulatory Visit | Attending: Adult Health Nurse Practitioner | Admitting: Adult Health Nurse Practitioner

## 2022-04-19 DIAGNOSIS — K743 Primary biliary cirrhosis: Secondary | ICD-10-CM | POA: Diagnosis present

## 2023-08-25 IMAGING — MG MM DIGITAL SCREENING BILAT W/ TOMO AND CAD
8 series · 8 of 24 positions shown · non-contrast
Comparison: Previous exam(s).

ACR Breast Density Category a: The breast tissue is almost entirely
fatty.

CLINICAL DATA: Screening.

EXAM:
DIGITAL SCREENING BILATERAL MAMMOGRAM WITH TOMOSYNTHESIS AND CAD
TECHNIQUE: Bilateral screening digital craniocaudal and mediolateral oblique
mammograms were obtained. Bilateral screening digital breast
tomosynthesis was performed. The images were evaluated with
computer-aided detection.

[L CC synth-2D]
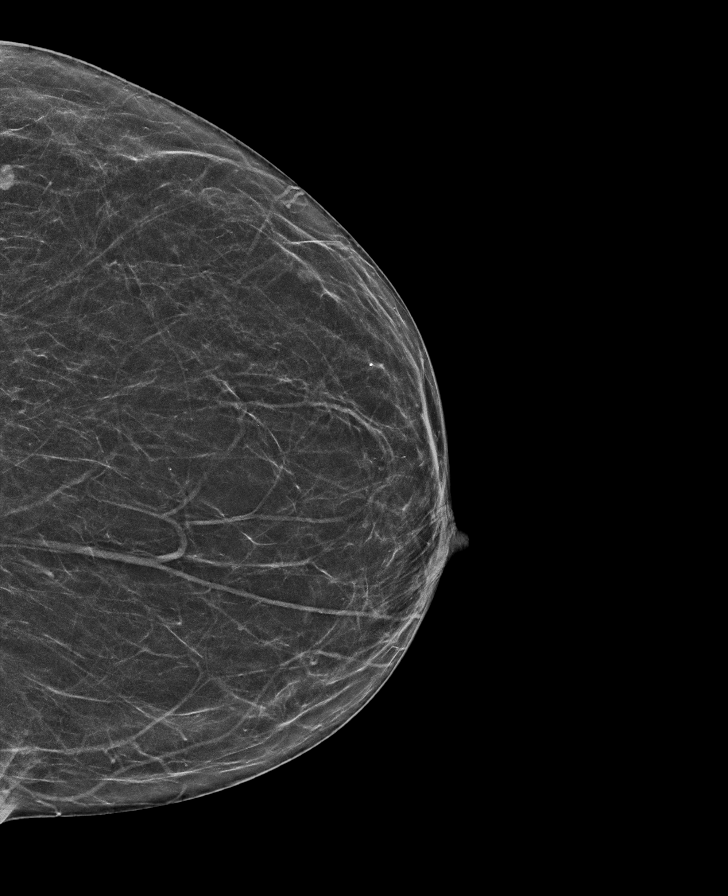

[R MLO synth-2D]
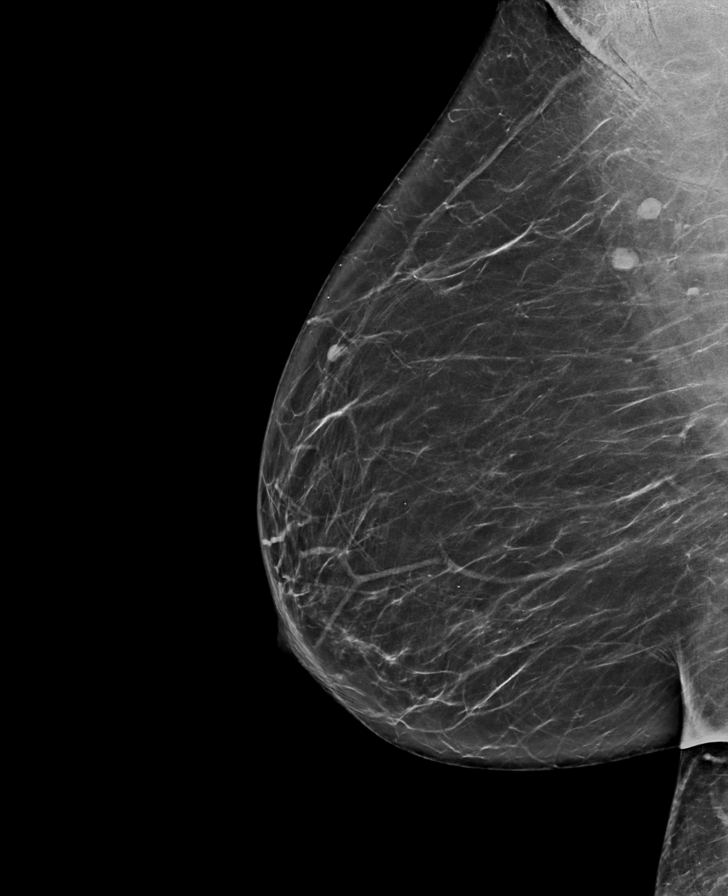

[R CC synth-2D]
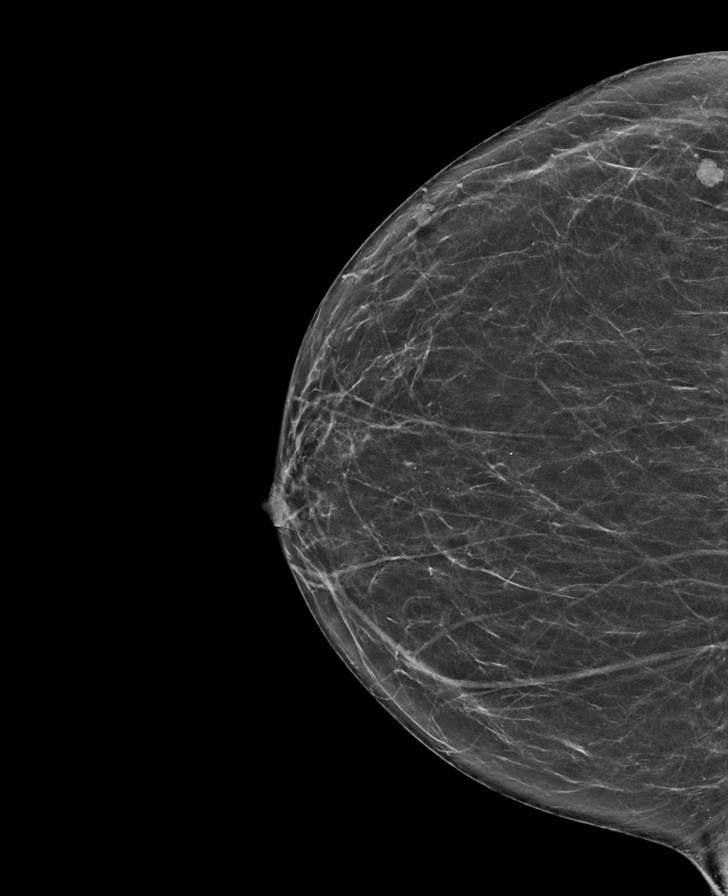

[L MLO synth-2D]
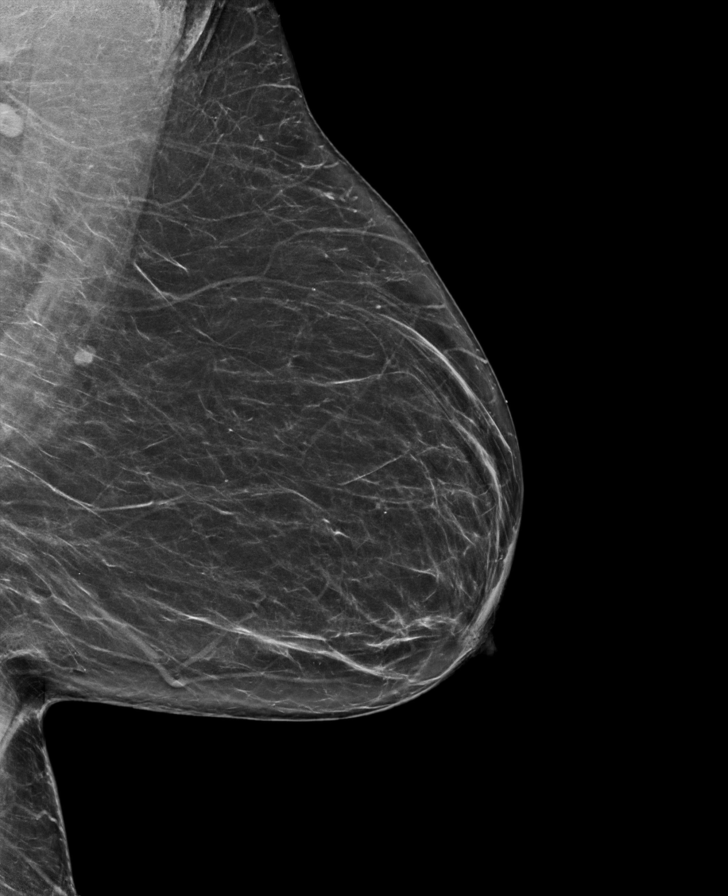

[L CC tomo · tomo slice 29/57.0]
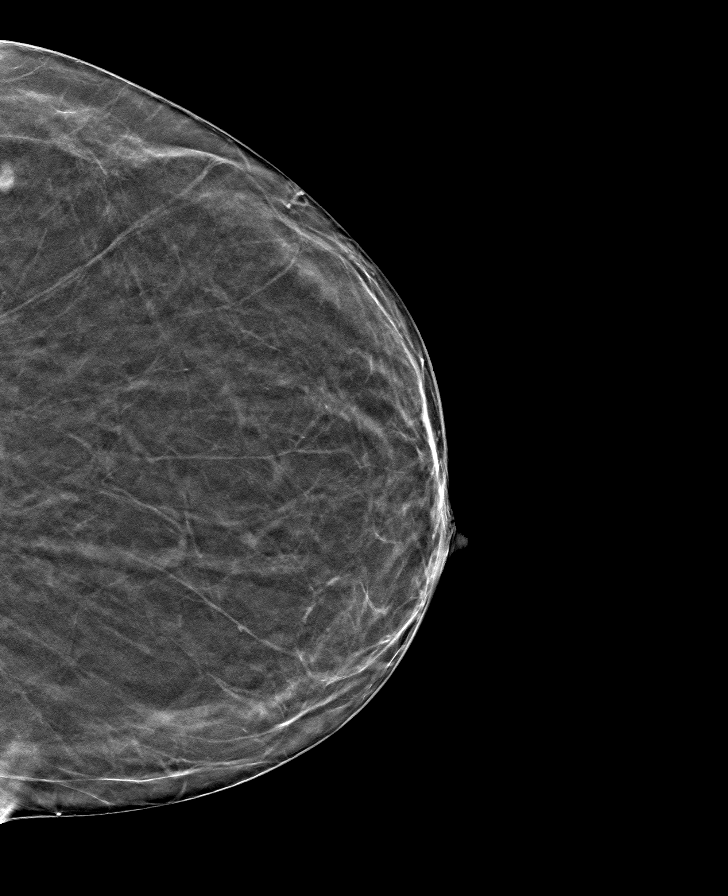

[R CC tomo · tomo slice 31/60.0]
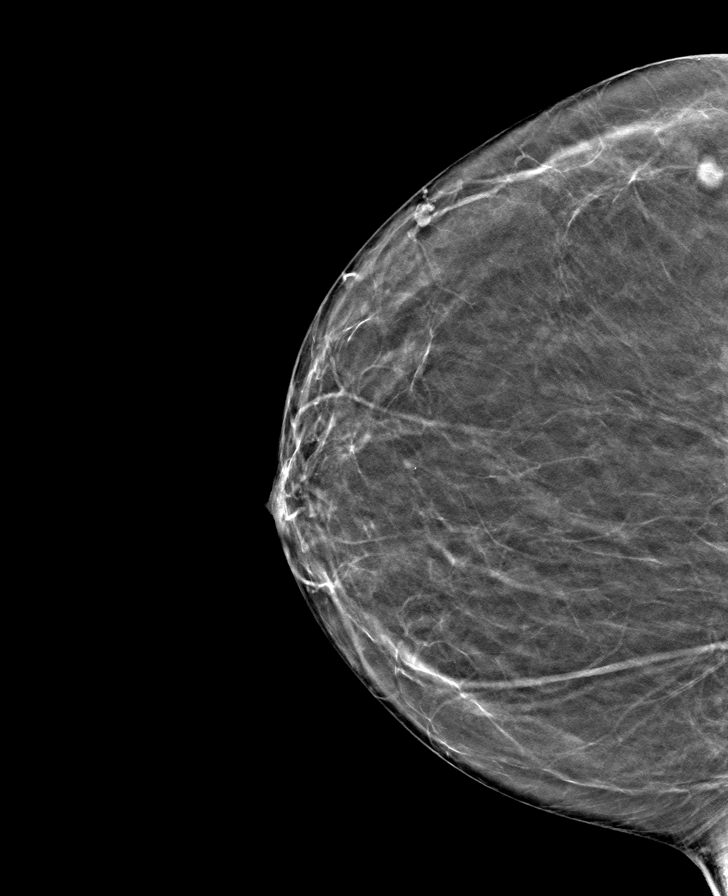

[R MLO tomo · tomo slice 37/73.0]
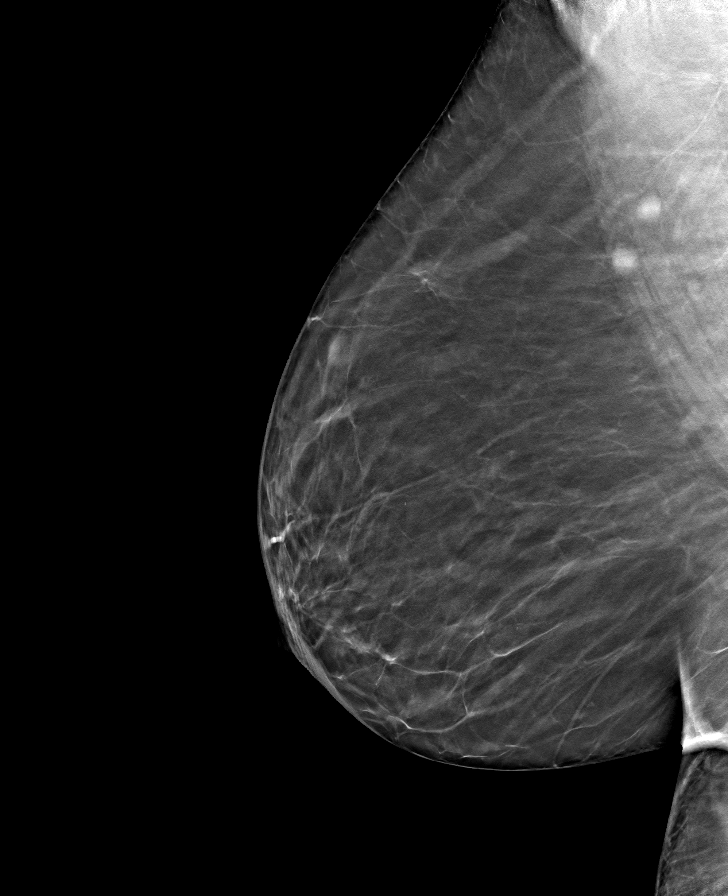

[L MLO tomo · tomo slice 37/73.0]
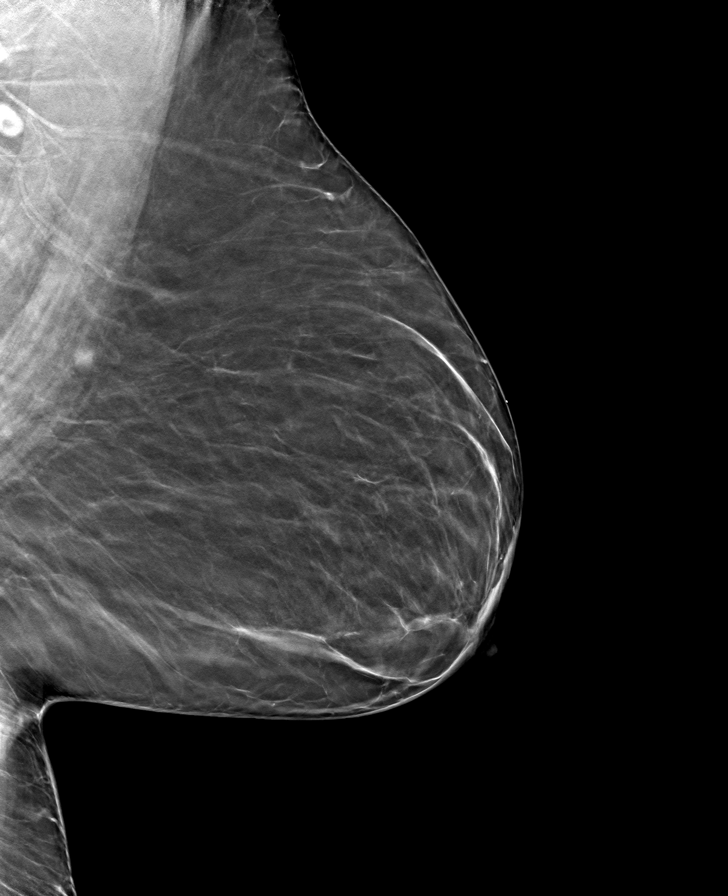

[8 of 24 positions shown; findings below may reference images not displayed]

FINDINGS: There are no findings suspicious for malignancy.
IMPRESSION: No mammographic evidence of malignancy. A result letter of this
screening mammogram will be mailed directly to the patient.

RECOMMENDATION:
Screening mammogram in one year. (Code:0E-3-N98)

BI-RADS CATEGORY  1: Negative.
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
## 2024-03-07 LAB — HM MAMMOGRAPHY

## 2024-05-17 ENCOUNTER — Ambulatory Visit (INDEPENDENT_AMBULATORY_CARE_PROVIDER_SITE_OTHER)

## 2024-05-17 VITALS — BP 104/66 | HR 97 | Resp 16 | Ht 63.0 in | Wt 170.0 lb

## 2024-05-17 DIAGNOSIS — K743 Primary biliary cirrhosis: Secondary | ICD-10-CM

## 2024-05-17 DIAGNOSIS — T63441A Toxic effect of venom of bees, accidental (unintentional), initial encounter: Secondary | ICD-10-CM

## 2024-05-17 DIAGNOSIS — Z205 Contact with and (suspected) exposure to viral hepatitis: Secondary | ICD-10-CM | POA: Insufficient documentation

## 2024-05-17 DIAGNOSIS — E118 Type 2 diabetes mellitus with unspecified complications: Secondary | ICD-10-CM

## 2024-05-17 DIAGNOSIS — Z7985 Long-term (current) use of injectable non-insulin antidiabetic drugs: Secondary | ICD-10-CM | POA: Diagnosis not present

## 2024-05-17 DIAGNOSIS — F251 Schizoaffective disorder, depressive type: Secondary | ICD-10-CM

## 2024-05-17 DIAGNOSIS — K219 Gastro-esophageal reflux disease without esophagitis: Secondary | ICD-10-CM

## 2024-05-17 DIAGNOSIS — Z1211 Encounter for screening for malignant neoplasm of colon: Secondary | ICD-10-CM

## 2024-05-17 MED ORDER — QUETIAPINE FUMARATE 300 MG PO TABS: 300.0000 mg | ORAL_TABLET | Freq: Every evening | ORAL | 3 refills | Status: AC
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
## 2024-05-18 ENCOUNTER — Ambulatory Visit: Payer: Self-pay

## 2024-05-18 LAB — COMPREHENSIVE METABOLIC PANEL WITH GFR
ALT: 22 IU/L (ref 0–32)
AST: 19 IU/L (ref 0–40)
Albumin: 4.4 g/dL (ref 3.8–4.9)
Alkaline Phosphatase: 196 IU/L — ABNORMAL HIGH (ref 49–135)
BUN/Creatinine Ratio: 14 (ref 9–23)
BUN: 9 mg/dL (ref 6–24)
Bilirubin Total: 0.5 mg/dL (ref 0.0–1.2)
CO2: 24 mmol/L (ref 20–29)
Calcium: 9.6 mg/dL (ref 8.7–10.2)
Chloride: 101 mmol/L (ref 96–106)
Creatinine, Ser: 0.64 mg/dL (ref 0.57–1.00)
Globulin, Total: 3.7 g/dL (ref 1.5–4.5)
Glucose: 89 mg/dL (ref 70–99)
Potassium: 4.4 mmol/L (ref 3.5–5.2)
Sodium: 139 mmol/L (ref 134–144)
Total Protein: 8.1 g/dL (ref 6.0–8.5)
eGFR: 102 mL/min/1.73 (ref >59)

## 2024-05-18 LAB — LIPID PANEL
Chol/HDL Ratio: 3.4 ratio (ref 0.0–4.4)
Cholesterol, Total: 158 mg/dL (ref 100–199)
HDL: 46 mg/dL (ref >39)
LDL Chol Calc (NIH): 96 mg/dL (ref 0–99)
Triglycerides: 86 mg/dL (ref 0–149)
VLDL Cholesterol Cal: 16 mg/dL (ref 5–40)

## 2024-05-18 LAB — HEMOGLOBIN A1C
Est. average glucose Bld gHb Est-mCnc: 114 mg/dL
Hgb A1c MFr Bld: 5.6 % (ref 4.8–5.6)

## 2024-05-18 LAB — MICROALBUMIN / CREATININE URINE RATIO
Creatinine, Urine: 64.3 mg/dL
Microalb/Creat Ratio: <5 mg/g{creat} (ref 0–29)
Microalbumin, Urine: <3 ug/mL

## 2024-05-21 ENCOUNTER — Other Ambulatory Visit: Payer: Self-pay

## 2024-05-21 ENCOUNTER — Telehealth: Payer: Self-pay

## 2024-05-21 DIAGNOSIS — R928 Other abnormal and inconclusive findings on diagnostic imaging of breast: Secondary | ICD-10-CM

## 2024-05-21 NOTE — Telephone Encounter (Signed)
 Per Ginger ok for Dr. Melany to see pt. Pt is unable to return to [facility]  due to transportation please schedule pt.

## 2024-05-21 NOTE — Telephone Encounter (Unsigned)
 Copied from CRM [id number]. Topic: Referral - Question >> May 21, 2024 12:27 PM Selinda RAMAN wrote: Reason for CRM: The patient called in stating she talked about a referral to [facility] with her provider but she sees it says permanently closed online. She is wondering if her provider found another location she wanted to refer her to for continued behavioral health? She also called to get the contact information for [facility] which I relayed to her. Please assist patient further.

## 2024-05-23 ENCOUNTER — Telehealth: Payer: Self-pay

## 2024-05-23 ENCOUNTER — Ambulatory Visit

## 2024-05-23 VITALS — BP 98/69 | HR 101 | Resp 16 | Ht 64.0 in | Wt 170.0 lb

## 2024-05-23 DIAGNOSIS — L299 Pruritus, unspecified: Secondary | ICD-10-CM | POA: Diagnosis not present

## 2024-05-23 DIAGNOSIS — F251 Schizoaffective disorder, depressive type: Secondary | ICD-10-CM | POA: Diagnosis not present

## 2024-05-23 MED ORDER — CITALOPRAM HYDROBROMIDE 20 MG PO TABS: 20.0000 mg | ORAL_TABLET | Freq: Every day | ORAL | 3 refills | Status: DC

## 2024-05-23 NOTE — Telephone Encounter (Signed)
 Copied from CRM [id number]. Topic: General - Other >> May 23, 2024  2:38 PM Delon T wrote: Reason for CRM: Patient called about appt today, states she does not suffer from Schizoaffective disorder, she was just not having a good day. Dr Franchot put it on her paperwork and she does not agree with it.

## 2024-05-23 NOTE — Patient Instructions (Signed)
 [facility] Address: [address], [place], [place] [address] Phone: [phone]  Call [facility] at [phone]. Leave name, DOB and phone number for them to call back if they don't answer.
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
## 2024-05-24 NOTE — Telephone Encounter (Signed)
 Noted, thanks!

## 2024-05-28 ENCOUNTER — Ambulatory Visit: Admission: RE | Admit: 2024-05-28 | Discharge: 2024-05-28 | Disposition: A | Payer: Self-pay | Source: Ambulatory Visit

## 2024-05-28 ENCOUNTER — Other Ambulatory Visit: Payer: Self-pay

## 2024-05-28 ENCOUNTER — Other Ambulatory Visit: Payer: Self-pay | Admitting: *Deleted

## 2024-05-28 ENCOUNTER — Ambulatory Visit (INDEPENDENT_AMBULATORY_CARE_PROVIDER_SITE_OTHER): Admitting: Family Medicine

## 2024-05-28 VITALS — BP 111/75 | HR 86 | Temp 98.4°F | Ht 64.0 in | Wt 167.4 lb

## 2024-05-28 DIAGNOSIS — K625 Hemorrhage of anus and rectum: Secondary | ICD-10-CM | POA: Diagnosis not present

## 2024-05-28 DIAGNOSIS — R1084 Generalized abdominal pain: Secondary | ICD-10-CM

## 2024-05-28 DIAGNOSIS — K743 Primary biliary cirrhosis: Secondary | ICD-10-CM | POA: Insufficient documentation

## 2024-05-28 DIAGNOSIS — Z1231 Encounter for screening mammogram for malignant neoplasm of breast: Secondary | ICD-10-CM

## 2024-05-28 DIAGNOSIS — R928 Other abnormal and inconclusive findings on diagnostic imaging of breast: Secondary | ICD-10-CM

## 2024-05-28 DIAGNOSIS — K5909 Other constipation: Secondary | ICD-10-CM | POA: Diagnosis not present

## 2024-05-28 MED ORDER — NA SULFATE-K SULFATE-MG SULF 17.5-3.13-1.6 GM/177ML PO SOLN: 1.0000 | Freq: Once | ORAL | 0 refills | Status: AC
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
## 2024-05-28 NOTE — Patient Instructions (Signed)
  Drink 64 ounces of Fluids Daily.  Start Miralax Mix 1 capful in a drink daily. Start Benefiber Mix 1 TBSP in a drink daily.

## 2024-05-29 ENCOUNTER — Other Ambulatory Visit: Payer: Self-pay | Admitting: Family Medicine

## 2024-05-29 DIAGNOSIS — K743 Primary biliary cirrhosis: Secondary | ICD-10-CM

## 2024-05-29 NOTE — Progress Notes (Signed)
 Upon further review of prior [facility] gastroenterolgy notes from 2023 and discussion with Dr. Jinny, will order AFP and ultrasound.  Patient is currently on ursodiol 500 mg 2 tablets in the morning and 1 tablet in the evening along with Ocaliva 5 mg once daily for primary biliary cholangitis. Alkaline phosphastase remains elevated, most recent alkaline phosphatase on 05/17/2024 was 196. Will try a PPAR agonist with ursodiol to try and normalize alk phos.   [facility] labs: ALKPHOS 262 (H) 03/04/2022 ; 265 on 10/15/2021.  I have called and left a voice message with patient to call office to discuss this.   03/2022 Fibroscan score of 26.7, F4 fibrosis.

## 2024-05-30 ENCOUNTER — Telehealth: Payer: Self-pay

## 2024-05-30 NOTE — Addendum Note (Signed)
 Addended by: TRUDY LEEANNA RAMAN on: 05/30/2024 08:27 AM   Modules accepted: Orders

## 2024-05-30 NOTE — Telephone Encounter (Signed)
 Completed [employer] enrollment form and faxed to [phone] for medication Livdelzi. Called insurance and will need PA.

## 2024-05-30 NOTE — Telephone Encounter (Signed)
 Spoke with patient- wanted to clarify what insurance she has-she denies having medicaid- she also wanted to cancel her colonoscopy- I am in the process of beginning enrollment form for new medication Livdelzi-and I let the patient know- also I let her know that some labs came back elevated -Grayce was available so I forwarded the call so she could ask questions to Robin. Patient will call back to confirm if she wants to Cancell the colonoscopy after speaking with her family- in the meantime I will complete the enrollment form and get it faxed.

## 2024-05-30 NOTE — Telephone Encounter (Signed)
 Pt requesting call back to discuss colorgard

## 2024-05-30 NOTE — Telephone Encounter (Signed)
 LVM for patient to return call to office-   Please go have AFP lab done.  RUQ US  scheduled Outpatient Imaging- Nothing to eat/drink after midnight-06/06/24 arrive at 7:45 to check in.   [id number]  [name]

## 2024-05-31 ENCOUNTER — Ambulatory Visit: Payer: Self-pay | Admitting: Family Medicine

## 2024-05-31 LAB — AFP TUMOR MARKER: AFP, Serum, Tumor Marker: 1.9 ng/mL (ref 0.0–9.2)

## 2024-05-31 LAB — CELIAC DISEASE AB SCREEN W/RFX
Deamidated Gliadin Abs, IgA: 9 U (ref 0–19)
Immunoglobulin A, (IgA) QN, Serum: 417 mg/dL — ABNORMAL HIGH (ref 87–352)
t-Transglutaminase (tTG) IgA: <2 U/mL (ref 0–3)

## 2024-06-01 ENCOUNTER — Ambulatory Visit
Admission: RE | Admit: 2024-06-01 | Discharge: 2024-06-01 | Disposition: A | Discharge status: Home / Self Care | Source: Ambulatory Visit

## 2024-06-01 DIAGNOSIS — R928 Other abnormal and inconclusive findings on diagnostic imaging of breast: Secondary | ICD-10-CM | POA: Insufficient documentation

## 2024-06-04 ENCOUNTER — Encounter: Payer: Self-pay | Admitting: Gastroenterology

## 2024-06-05 ENCOUNTER — Ambulatory Visit
Admission: RE | Admit: 2024-06-05 | Discharge: 2024-06-05 | Disposition: A | Discharge status: Home / Self Care | Attending: Gastroenterology | Admitting: Gastroenterology

## 2024-06-05 ENCOUNTER — Telehealth: Payer: Self-pay

## 2024-06-05 ENCOUNTER — Telehealth: Payer: Self-pay | Admitting: Family Medicine

## 2024-06-05 ENCOUNTER — Encounter
Admission: RE | Disposition: A | Discharge status: Home / Self Care | Payer: Self-pay | Source: Home / Self Care | Attending: Gastroenterology

## 2024-06-05 ENCOUNTER — Other Ambulatory Visit: Payer: Self-pay

## 2024-06-05 DIAGNOSIS — K625 Hemorrhage of anus and rectum: Secondary | ICD-10-CM

## 2024-06-05 DIAGNOSIS — K743 Primary biliary cirrhosis: Secondary | ICD-10-CM

## 2024-06-05 HISTORY — DX: Primary biliary cirrhosis: K74.3

## 2024-06-05 HISTORY — DX: Type 2 diabetes mellitus without complications: E11.9

## 2024-06-05 HISTORY — DX: Schizoaffective disorder, unspecified: F25.9

## 2024-06-05 SURGERY — COLONOSCOPY
Anesthesia: General

## 2024-06-05 MED ORDER — NA SULFATE-K SULFATE-MG SULF 17.5-3.13-1.6 GM/177ML PO SOLN: 1.0000 | Freq: Once | ORAL | 0 refills | Status: AC

## 2024-06-05 MED ORDER — PEG 3350-KCL-NA BICARB-NACL 420 G PO SOLR: 4000.0000 mL | Freq: Once | ORAL | 0 refills | Status: AC

## 2024-06-05 MED ORDER — SODIUM CHLORIDE 0.9 % IV SOLN: INTRAVENOUS | Status: DC

## 2024-06-05 NOTE — Telephone Encounter (Signed)
 Please call patient to let her know that [facility] is requesting an additional AMA lab for her Livdelzi.

## 2024-06-05 NOTE — Telephone Encounter (Signed)
  Tried calling patient- unable to leave message-mailbox not set up. Please call patient to let her know that [facility] is requesting an additional AMA lab for her Livdelzi.

## 2024-06-05 NOTE — Telephone Encounter (Signed)
 Patient was scheduled for colonoscopy 11/18/2 per Dr. Jinny needs 2 day prep- spoke with patient -she ate spaghetti day of clear liquid diet. Patient was instructed to follow bowel prep instructions as she is on 2 day prep and to not eat solid foods.Rescheduled 07/10/24 [facility] Dr.Scholls.

## 2024-06-05 NOTE — Telephone Encounter (Signed)
 Per Dr.Wohl- poor prep for colonoscopy-needs 2 day prep.   Tried reaching patient to reschedule- unable to leave message-mailbox not set up.

## 2024-06-05 NOTE — Anesthesia Preprocedure Evaluation (Signed)
 Anesthesia Evaluation  Patient identified by MRN, date of birth, ID band Patient awake    Reviewed: Allergy & Precautions, H&P , NPO status , Patient's Chart, lab work & pertinent test results  Airway        Dental   Pulmonary neg pulmonary ROS          Cardiovascular negative cardio ROS      Neuro/Psych  Headaches   Depression  Schizophrenia   negative psych ROS   GI/Hepatic Neg liver ROS,GERD  ,,(+) Hepatitis -, C  Endo/Other  diabetes    Renal/GU negative Renal ROS  negative genitourinary   Musculoskeletal negative musculoskeletal ROS (+)    Abdominal   Peds negative pediatric ROS (+)  Hematology negative hematology ROS (+)   Anesthesia Other Findings On ozempic--last dose ?  Reproductive/Obstetrics negative OB ROS                              Anesthesia Physical Anesthesia Plan  ASA:   Anesthesia Plan: General   Post-op Pain Management:    Induction:   PONV Risk Score and Plan: TIVA and Propofol infusion  Airway Management Planned:   Additional Equipment:   Intra-op Plan:   Post-operative Plan:   Informed Consent: I have reviewed the patients History and Physical, chart, labs and discussed the procedure including the risks, benefits and alternatives for the proposed anesthesia with the patient or authorized representative who has indicated his/her understanding and acceptance.       Plan Discussed with: CRNA  Anesthesia Plan Comments: (IVGA with LMA as backup )        Anesthesia Quick Evaluation

## 2024-06-05 NOTE — Telephone Encounter (Signed)
 Per Dr Jinny, came in with a poor prep. Needs 2 day prep and reschedule

## 2024-06-06 ENCOUNTER — Telehealth: Payer: Self-pay

## 2024-06-06 ENCOUNTER — Ambulatory Visit

## 2024-06-06 NOTE — Telephone Encounter (Signed)
 Spoke with patient she will go to lab today for AMA titer.

## 2024-06-06 NOTE — Telephone Encounter (Signed)
 Tried reaching patient several times this morning-mail box not set-up -unable to leave message.

## 2024-06-07 ENCOUNTER — Telehealth: Payer: Self-pay

## 2024-06-07 MED ORDER — LIVDELZI 10 MG PO CAPS: 10.0000 mg | ORAL_CAPSULE | Freq: Every day | ORAL | 12 refills | Status: AC

## 2024-06-07 NOTE — Telephone Encounter (Signed)
 Error

## 2024-06-07 NOTE — Telephone Encounter (Signed)
 Received approval on Livdelzi 10 mg capsule -good thru 07/18/2025-spoke Stacy -effective date 07/20/2023- PA # [id number]-[facility]- Zero copay. They call her and let her know.  Placed approval letter in basket to be scanned into chart.

## 2024-06-08 NOTE — Telephone Encounter (Signed)
 error

## 2024-06-11 ENCOUNTER — Ambulatory Visit: Payer: Self-pay | Admitting: Family Medicine

## 2024-06-11 ENCOUNTER — Ambulatory Visit: Payer: Self-pay | Admitting: Psychiatry

## 2024-06-11 LAB — MITOCHONDRIAL ANTIBODIES: Mitochondrial Ab: 35.1 U — ABNORMAL HIGH (ref 0.0–20.0)

## 2024-06-22 ENCOUNTER — Ambulatory Visit

## 2024-06-22 ENCOUNTER — Other Ambulatory Visit (HOSPITAL_COMMUNITY)
Admission: RE | Admit: 2024-06-22 | Discharge: 2024-06-22 | Disposition: A | Discharge status: Home / Self Care | Source: Ambulatory Visit

## 2024-06-22 VITALS — BP 118/87 | HR 100 | Ht 64.0 in | Wt 169.4 lb

## 2024-06-22 DIAGNOSIS — E119 Type 2 diabetes mellitus without complications: Secondary | ICD-10-CM

## 2024-06-22 DIAGNOSIS — E118 Type 2 diabetes mellitus with unspecified complications: Secondary | ICD-10-CM

## 2024-06-22 DIAGNOSIS — Z124 Encounter for screening for malignant neoplasm of cervix: Secondary | ICD-10-CM | POA: Diagnosis not present

## 2024-06-22 NOTE — Progress Notes (Unsigned)
 Established patient visit   Patient: Jessica Watkins   DOB: 16-Oct-1965   58 y.o. Female  MRN: [id number] Visit Date: 06/22/2024  Today's healthcare provider: Isaiah DELENA Pepper, MD   Chief Complaint  Patient presents with   Medical Management of Chronic Issues    Patient is present for f/u with PCP and pap smear. Patient is doing well overall, given info for [facility]    Gynecologic Exam   Subjective    HPI  Discussed the use of AI scribe software for clinical note transcription with the patient, who gave verbal consent to proceed.  History of Present Illness Jessica Watkins is a 58 year old female who presents for a Pap smear.  She recalls having an abnormal Pap smear in the past, possibly around 2018 or earlier, but is unsure of the exact date.  She is scheduled for a psychiatry appointment on December 22nd and a colonoscopy on December 23rd.   She has been taking her diabetes medications as prescribed.   Medications: Outpatient Medications Prior to Visit  Medication Sig   atorvastatin  (LIPITOR) 10 MG tablet Take 10 mg by mouth daily.   benztropine  (COGENTIN ) 1 MG tablet TAKE 3 TABLET (3 MG) BY ORAL ROUTE 2 TIMES PER DAY   citalopram  (CELEXA ) 20 MG tablet Take 1 tablet (20 mg total) by mouth daily.   FARXIGA 10 MG TABS tablet Take 10 mg by mouth daily.   haloperidol  decanoate (HALDOL  DECANOATE) 100 MG/ML injection Inject 100 mg into the muscle every 28 (twenty-eight) days.   lisinopril (ZESTRIL) 2.5 MG tablet Take 2.5 mg by mouth daily.   metFORMIN (GLUCOPHAGE) 1000 MG tablet Take 1,000 mg by mouth 2 (two) times daily.   OCALIVA 5 MG TABS Take 1 tablet by mouth daily.   omeprazole (PRILOSEC) 20 MG capsule Take 20 mg by mouth daily.   OZEMPIC, 1 MG/DOSE, 4 MG/3ML SOPN Inject 1 mg into the skin once a week.   QUEtiapine  (SEROQUEL ) 300 MG tablet Take 1 tablet (300 mg total) by mouth at bedtime.   Seladelpar Lysine  (LIVDELZI ) 10 MG CAPS Take 10 mg by mouth daily at 12 noon.    ursodiol (ACTIGALL) 500 MG tablet Take 500 mg by mouth in the morning and at bedtime. TAKE 2 TABLETS BY MOUTH IN AM AND 1 TABLET IN THE PM.   No facility-administered medications prior to visit.    Review of Systems as noted in HPI.      Objective    BP 118/87 (BP Location: Left Arm, Patient Position: Sitting, Cuff Size: Normal)   Pulse 100   Ht 5' 4 (1.626 m)   Wt 169 lb 6.4 oz (76.8 kg)   LMP 04/29/2012   SpO2 99%   BMI 29.08 kg/m    Physical Exam Exam conducted with a chaperone present.  Constitutional:      Appearance: Normal appearance.  HENT:     Head: Normocephalic and atraumatic.     Mouth/Throat:     Mouth: Mucous membranes are moist.  Eyes:     Pupils: Pupils are equal, round, and reactive to light.  Pulmonary:     Effort: Pulmonary effort is normal.  Genitourinary:    General: Normal vulva.     Vagina: Normal.     Cervix: Normal.  Skin:    General: Skin is warm.  Neurological:     General: No focal deficit present.     Mental Status: She is alert.  No results found for any visits on 06/22/24.  Assessment & Plan     Problem List Items Addressed This Visit       Endocrine   Diabetes mellitus without complication (HCC) - Primary   Relevant Orders   HM Diabetes Foot Exam (Completed)   Other Visit Diagnoses       Cervical cancer screening       Relevant Orders   Cytology - PAP      Assessment & Plan Cervical Cancer Screening Routine visit with Pap smear and HPV screening performed. Has had abnormal results- ASCUS with +HPV noted on problem list in 2018, however records unavailable.  - Performed Pap smear and HPV screening. - Will communicate results to patient  Type 2 diabetes mellitus Chronic, controlled. Diabetes management well-controlled with current medications including Farxiga, Ozempic, and metformin. Foot exam normal. Reviewed recent labs with patient which were normal. - Continue current diabetes medications. - Follow up  in 6 months   Return in about 4 weeks (around 07/20/2024) for AWV with Medicare Team.       Isaiah DELENA Pepper, MD  [facility] [phone] (phone) [phone] (fax)

## 2024-06-22 NOTE — Progress Notes (Deleted)
      Established patient visit   Patient: Jessica Watkins   DOB: 08-16-1965   58 y.o. Female  MRN: [id number] Visit Date: 06/22/2024  Today's healthcare provider: Isaiah DELENA Pepper, MD   No chief complaint on file.  Subjective    HPI  Discussed the use of AI scribe software for clinical note transcription with the patient, who gave verbal consent to proceed.  History of Present Illness      Medications: Outpatient Medications Prior to Visit  Medication Sig   atorvastatin  (LIPITOR) 10 MG tablet Take 10 mg by mouth daily.   benztropine  (COGENTIN ) 1 MG tablet TAKE 3 TABLET (3 MG) BY ORAL ROUTE 2 TIMES PER DAY   citalopram  (CELEXA ) 20 MG tablet Take 1 tablet (20 mg total) by mouth daily.   FARXIGA 10 MG TABS tablet Take 10 mg by mouth daily.   haloperidol  decanoate (HALDOL  DECANOATE) 100 MG/ML injection Inject 100 mg into the muscle every 28 (twenty-eight) days.   lisinopril (ZESTRIL) 2.5 MG tablet Take 2.5 mg by mouth daily.   metFORMIN (GLUCOPHAGE) 1000 MG tablet Take 1,000 mg by mouth 2 (two) times daily.   OCALIVA 5 MG TABS Take 1 tablet by mouth daily.   omeprazole (PRILOSEC) 20 MG capsule Take 20 mg by mouth daily.   OZEMPIC, 1 MG/DOSE, 4 MG/3ML SOPN Inject 1 mg into the skin once a week.   QUEtiapine  (SEROQUEL ) 300 MG tablet Take 1 tablet (300 mg total) by mouth at bedtime.   Seladelpar Lysine  (LIVDELZI ) 10 MG CAPS Take 10 mg by mouth daily at 12 noon.   ursodiol (ACTIGALL) 500 MG tablet Take 500 mg by mouth in the morning and at bedtime. TAKE 2 TABLETS BY MOUTH IN AM AND 1 TABLET IN THE PM.   No facility-administered medications prior to visit.    Review of Systems as noted in HPI.  {Insert previous labs (optional):23779} {See past labs  Heme  Chem  Endocrine  Serology  Results Review (optional):1}   Objective    LMP 04/29/2012  {Insert last BP/Wt (optional):23777}{See vitals history (optional):1}  Physical Exam   No results found for any visits on  06/22/24.  Assessment & Plan     Problem List Items Addressed This Visit   None   Assessment and Plan Assessment & Plan       No follow-ups on file.       Isaiah DELENA Pepper, MD  [facility] [phone] (phone) [phone] (fax)

## 2024-06-22 NOTE — Patient Instructions (Signed)
 Please call [facility] to schedule an appointment [address], [place], [place] [address] Phone: [phone]

## 2024-06-26 ENCOUNTER — Ambulatory Visit: Admitting: Physician Assistant

## 2024-06-27 ENCOUNTER — Ambulatory Visit: Payer: Self-pay

## 2024-06-27 DIAGNOSIS — R87610 Atypical squamous cells of undetermined significance on cytologic smear of cervix (ASC-US): Secondary | ICD-10-CM

## 2024-06-27 LAB — CYTOLOGY - PAP
Chlamydia: NEGATIVE
Comment: NEGATIVE
Comment: NEGATIVE
Comment: NEGATIVE
Comment: NORMAL
Diagnosis: UNDETERMINED — AB
High risk HPV: NEGATIVE
Neisseria Gonorrhea: NEGATIVE
Trichomonas: NEGATIVE

## 2024-06-28 NOTE — Progress Notes (Signed)
 Called patient Jessica Watkins asking why patient is declining the referral, if patient calls back ok for E2C2 to get the information pertaining to her refusing the referral.  Will also try to call again later.

## 2024-07-09 ENCOUNTER — Encounter: Payer: Self-pay | Admitting: Gastroenterology

## 2024-07-09 ENCOUNTER — Ambulatory Visit: Payer: Self-pay | Admitting: Psychiatry

## 2024-07-10 ENCOUNTER — Ambulatory Visit: Admission: RE | Admit: 2024-07-10 | Admitting: Gastroenterology

## 2024-07-10 ENCOUNTER — Encounter: Admission: RE | Payer: Self-pay

## 2024-07-10 SURGERY — COLONOSCOPY
Anesthesia: General

## 2024-07-17 ENCOUNTER — Ambulatory Visit (INDEPENDENT_AMBULATORY_CARE_PROVIDER_SITE_OTHER)

## 2024-07-17 VITALS — BP 94/67 | HR 81 | Temp 98.4°F | Wt 172.3 lb

## 2024-07-17 DIAGNOSIS — E119 Type 2 diabetes mellitus without complications: Secondary | ICD-10-CM

## 2024-07-17 DIAGNOSIS — K743 Primary biliary cirrhosis: Secondary | ICD-10-CM | POA: Diagnosis not present

## 2024-07-17 DIAGNOSIS — Z7984 Long term (current) use of oral hypoglycemic drugs: Secondary | ICD-10-CM | POA: Diagnosis not present

## 2024-07-17 DIAGNOSIS — R87610 Atypical squamous cells of undetermined significance on cytologic smear of cervix (ASC-US): Secondary | ICD-10-CM | POA: Insufficient documentation

## 2024-07-17 DIAGNOSIS — F251 Schizoaffective disorder, depressive type: Secondary | ICD-10-CM

## 2024-07-17 MED ORDER — OZEMPIC (1 MG/DOSE) 4 MG/3ML ~~LOC~~ SOPN: 1.0000 mg | PEN_INJECTOR | SUBCUTANEOUS | 3 refills | Status: AC

## 2024-07-17 MED ORDER — METFORMIN HCL 1000 MG PO TABS: 1000.0000 mg | ORAL_TABLET | Freq: Two times a day (BID) | ORAL | 3 refills | Status: AC

## 2024-07-17 MED ORDER — ATORVASTATIN CALCIUM 10 MG PO TABS: 10.0000 mg | ORAL_TABLET | Freq: Every day | ORAL | 3 refills | Status: AC

## 2024-07-17 MED ORDER — OCALIVA 5 MG PO TABS: 1.0000 | ORAL_TABLET | Freq: Every day | ORAL | 3 refills | Status: AC

## 2024-07-17 MED ORDER — ESCITALOPRAM OXALATE 20 MG PO TABS: 20.0000 mg | ORAL_TABLET | Freq: Every day | ORAL | 3 refills | Status: AC

## 2024-07-17 MED ORDER — URSODIOL 500 MG PO TABS: 500.0000 mg | ORAL_TABLET | Freq: Two times a day (BID) | ORAL | 3 refills | Status: DC

## 2024-07-17 MED ORDER — BENZTROPINE MESYLATE 0.5 MG PO TABS: 0.5000 mg | ORAL_TABLET | Freq: Two times a day (BID) | ORAL | 3 refills | Status: AC

## 2024-07-17 MED ORDER — GABAPENTIN 100 MG PO CAPS: 100.0000 mg | ORAL_CAPSULE | Freq: Two times a day (BID) | ORAL | 3 refills | Status: AC

## 2024-07-17 MED ORDER — OMEPRAZOLE 20 MG PO CPDR: 20.0000 mg | DELAYED_RELEASE_CAPSULE | Freq: Every day | ORAL | 3 refills | Status: AC

## 2024-07-17 NOTE — Patient Instructions (Addendum)
 [facility] [address] [place], [place] [address] [phone]   [facility] at [facility] [address] [place],  [place]  [address] Main: [phone]

## 2024-07-17 NOTE — Progress Notes (Signed)
 "     Established patient visit   Patient: Jessica Watkins   DOB: 03-Jun-1966   58 y.o. Female  MRN: [id number] Visit Date: 07/17/2024  Today's healthcare provider: Isaiah DELENA Pepper, MD   Chief Complaint  Patient presents with   Hospitalization Follow-up    Medication review    Subjective    HPI  Discussed the use of AI scribe software for clinical note transcription with the patient, who gave verbal consent to proceed.  History of Present Illness Jessica Watkins is a 58 year old female with bipolar depression and schizophrenia who presents for medication management and follow-up after a recent hospitalization. She is accompanied by her daughter.  She was recently hospitalized at [facility] in [place] for approximately ten days due to a schizophrenic episode characterized by hallucinations and auditory hallucinations. Initially, she was taken to a hospital in [place] after consulting a mental health helpline. She was discharged on July 11, 2024. During her hospitalization, her medication regimen was adjusted and there is confusion regarding her medications. She is currently taking Lexapro and quetiapine . She was also recommended to start trazodone .  She has a history of diabetes and was previously on multiple medications including metformin, glipizide, farxiga, and Ozempic. She stopped taking metformin about a month ago and has not been taking Ozempic since before a scheduled colonoscopy. Her blood sugar was monitored during her hospital stay and was reported to be stable. She has not been checking her blood sugar at home recently.  She is currently taking lisinopril. She has a history of taking gabapentin, and describes a sensation of heat in her neck and head.  She has a history of liver issues and is taking ursodiol. She was previously on Ocaliva and is awaiting a refill. She also takes atorvastatin .  She has a history of abnormal Pap smears and was informed of an abnormal result  from a recent test. She has not yet followed up with an OB GYN for further evaluation.   Medications: Show/hide medication list[1]  Review of Systems as noted in HPI.      Objective    BP 94/67   Pulse 81   Temp 98.4 F (36.9 C) (Oral)   Wt 172 lb 4.8 oz (78.2 kg)   BMI 29.58 kg/m    Physical Exam Constitutional:      Appearance: Normal appearance.  HENT:     Head: Normocephalic and atraumatic.     Mouth/Throat:     Mouth: Mucous membranes are moist.  Eyes:     Pupils: Pupils are equal, round, and reactive to light.  Pulmonary:     Effort: Pulmonary effort is normal.  Skin:    General: Skin is warm.  Neurological:     General: No focal deficit present.     Mental Status: She is alert.      No results found for any visits on 07/17/24.  Assessment & Plan     Problem List Items Addressed This Visit       Digestive   Primary biliary cirrhosis (HCC)   Relevant Medications   OCALIVA 5 MG TABS   ursodiol (ACTIGALL) 500 MG tablet     Endocrine   Diabetes mellitus without complication (HCC)   Relevant Medications   atorvastatin  (LIPITOR) 10 MG tablet   metFORMIN (GLUCOPHAGE) 1000 MG tablet   OZEMPIC, 1 MG/DOSE, 4 MG/3ML SOPN     Other   Schizoaffective disorder, depressive type (HCC) - Primary   Relevant Medications  benztropine  (COGENTIN ) 0.5 MG tablet   escitalopram (LEXAPRO) 20 MG tablet   gabapentin (NEURONTIN) 100 MG capsule   ASCUS of cervix with negative high risk HPV   Assessment & Plan Type 2 diabetes mellitus (HCC) Previously well-controlled, concerns about hyperglycemia after stopping her medications. Blood sugar stable during hospitalization, home monitoring needed. - Restart metformin 1000 MG oral twice daily. - Restart Ozempic 1 MG/DOSE subcutaneous once a week. - Continue atorvastatin  10 MG oral daily. - Check blood sugar levels at home. - Scheduled in-person diabetes follow-up in early February.  Schizoaffective disorder  (HCC) Chronic, uncontrolled. Patient is now living with her daughter at [facility]. Recent hospitalization at [facility] with medication adjustments. Unfortunately I do not have access to the discharge summary from the hospitalization. Reviewed medications in extensive detail today with patient and daughter. Patient has not yet established with OP psychiatry. - Continue quetiapine  300 MG oral at bedtime. - Continue lexapro 20mg  daily - Continue benztropine  BID - Follow up with psychiatrist at [facility]. Daughter will call the office to schedule an appointment.  Primary biliary cirrhosis (HCC) Managed with ursodiol and Ocaliva, requires Ocaliva refill. - Refilled Ocaliva 5 MG oral daily. - Continue ursodiol 500 MG oral in the morning and at bedtime. - Follow up with GI  Abnormal cervical cancer screening Previous abnormal Pap smear, importance of colposcopy discussed. - Provided referral to OB GYN for colposcopy. - Encouraged follow-up with OB GYN for further evaluation.     Return in about 6 weeks (around 08/28/2024) for Diabetes follow up in person.       Isaiah DELENA Pepper, MD  [facility] [phone] (phone) [phone] (fax)     [1]  Outpatient Medications Prior to Visit  Medication Sig   QUEtiapine  (SEROQUEL ) 300 MG tablet Take 1 tablet (300 mg total) by mouth at bedtime.   [DISCONTINUED] atorvastatin  (LIPITOR) 10 MG tablet Take 10 mg by mouth daily.   [DISCONTINUED] benztropine  (COGENTIN ) 1 MG tablet TAKE 3 TABLET (3 MG) BY ORAL ROUTE 2 TIMES PER DAY (Patient taking differently: Take 0.5 mg by mouth daily. Take 2x daily)   [DISCONTINUED] escitalopram (LEXAPRO) 20 MG tablet Take 20 mg by mouth daily.   [DISCONTINUED] gabapentin (NEURONTIN) 100 MG capsule Take 100 mg by mouth 2 (two) times daily.   [DISCONTINUED] glipiZIDE (GLUCOTROL XL) 5 MG 24 hr tablet Take 5 mg by mouth daily with breakfast.   [DISCONTINUED]  lisinopril (ZESTRIL) 2.5 MG tablet Take 2.5 mg by mouth daily.   [DISCONTINUED] OCALIVA 5 MG TABS Take 1 tablet by mouth daily.   [DISCONTINUED] omeprazole (PRILOSEC) 20 MG capsule Take 20 mg by mouth daily.   [DISCONTINUED] traZODone  (DESYREL ) 100 MG tablet Take 100 mg by mouth at bedtime.   [DISCONTINUED] ursodiol (ACTIGALL) 500 MG tablet Take 500 mg by mouth in the morning and at bedtime. TAKE 2 TABLETS BY MOUTH IN AM AND 1 TABLET IN THE PM.   Seladelpar Lysine  (LIVDELZI ) 10 MG CAPS Take 10 mg by mouth daily at 12 noon.   [DISCONTINUED] citalopram  (CELEXA ) 20 MG tablet Take 1 tablet (20 mg total) by mouth daily. (Patient not taking: Reported on 07/17/2024)   [DISCONTINUED] FARXIGA 10 MG TABS tablet Take 10 mg by mouth daily. (Patient not taking: Reported on 07/17/2024)   [DISCONTINUED] haloperidol  decanoate (HALDOL  DECANOATE) 100 MG/ML injection Inject 100 mg into the muscle every 28 (twenty-eight) days. (Patient not taking: Reported on 07/17/2024)   [DISCONTINUED] metFORMIN (GLUCOPHAGE) 1000 MG  tablet Take 1,000 mg by mouth 2 (two) times daily. (Patient not taking: Reported on 07/17/2024)   [DISCONTINUED] OZEMPIC, 1 MG/DOSE, 4 MG/3ML SOPN Inject 1 mg into the skin once a week. (Patient not taking: Reported on 07/17/2024)   No facility-administered medications prior to visit.   "

## 2024-07-25 ENCOUNTER — Ambulatory Visit

## 2024-07-25 ENCOUNTER — Telehealth: Payer: Self-pay | Admitting: Family Medicine

## 2024-07-25 DIAGNOSIS — Z1211 Encounter for screening for malignant neoplasm of colon: Secondary | ICD-10-CM

## 2024-07-25 DIAGNOSIS — Z0000 Encounter for general adult medical examination without abnormal findings: Secondary | ICD-10-CM

## 2024-07-25 NOTE — Patient Instructions (Addendum)
 Jessica Watkins,  Thank you for taking the time for your Medicare Wellness Visit. I appreciate your continued commitment to your health goals. Please review the care plan we discussed, and feel free to reach out if I can assist you further.  Please note that Annual Wellness Visits do not include a physical exam. Some assessments may be limited, especially if the visit was conducted virtually. If needed, we may recommend an in-person follow-up with your provider.  Ongoing Care Seeing your primary care provider every 3 to 6 months helps us  monitor your health and provide consistent, personalized care. APPT WITH DR.CARTER ON 09/07/24 @ 10:20 AM IN PERSON  Referrals If a referral was made during today's visit and you haven't received any updates within two weeks, please contact the referred provider directly to check on the status.  COLOGUARD ORDERED   Recommended Screenings:  Health Maintenance  Topic Date Due   Pneumococcal Vaccine for age over 13 (1 of 2 - PCV) Never done   Colon Cancer Screening  Never done   DTaP/Tdap/Td vaccine (2 - Td or Tdap) 04/10/2022   Flu Shot  02/17/2024   COVID-19 Vaccine (3 - 2025-26 season) 03/19/2024   HIV Screening  05/17/2025*   Hemoglobin A1C  11/15/2024   Eye exam for diabetics  01/16/2025   Yearly kidney function blood test for diabetes  05/17/2025   Yearly kidney health urinalysis for diabetes  05/17/2025   Complete foot exam   06/22/2025   Medicare Annual Wellness Visit  07/25/2025   Breast Cancer Screening  06/01/2026   Pap with HPV screening  06/22/2029   Hepatitis B Vaccine  Completed   Hepatitis C Screening  Completed   Zoster (Shingles) Vaccine  Completed   HPV Vaccine  Aged Out   Meningitis B Vaccine  Aged Out  *Topic was postponed. The date shown is not the original due date.     Vision: Annual vision screenings are recommended for early detection of glaucoma, cataracts, and diabetic retinopathy. These exams can also reveal signs of  chronic conditions such as diabetes and high blood pressure.  Dental: Annual dental screenings help detect early signs of oral cancer, gum disease, and other conditions linked to overall health, including heart disease and diabetes.  Please see the attached documents for additional preventive care recommendations.   NEXT AWV  07/30/25 @ 3:50 PM IN PERSON

## 2024-07-25 NOTE — Telephone Encounter (Signed)
 The patient called with questions regarding Jessica Watkins appointment scheduled for Monday, 07/30/2024 at 8:30 AM. I informed Jessica Watkins that this appointment was a follow-up visit after Jessica Watkins procedure. I explained that she would need to reschedule the procedure first, and that Jessica Watkins follow-up appointment would be rescheduled afterward. I reviewed GI symptoms with Jessica Watkins, including rectal bleeding, abdominal pain, nausea, and vomiting; the patient denied experiencing any symptoms at this time. She requested to reschedule Jessica Watkins procedure for a Friday.

## 2024-07-25 NOTE — Progress Notes (Signed)
 "  Chief Complaint  Patient presents with   Medicare Wellness     Subjective:   Jessica Watkins is a 59 y.o. female who presents for a Medicare Annual Wellness Visit.  Visit info / Clinical Intake: Medicare Wellness Visit Type:: Subsequent Annual Wellness Visit Persons participating in visit and providing information:: patient Medicare Wellness Visit Mode:: Telephone If telephone:: video declined Since this visit was completed virtually, some vitals may be partially provided or unavailable. Missing vitals are due to the limitations of the virtual format.: Unable to obtain vitals - no equipment If Telephone or Video please confirm:: I connected with patient using audio/video enable telemedicine. I verified patient identity with two identifiers, discussed telehealth limitations, and patient agreed to proceed. Patient Location:: home Provider Location:: office Interpreter Needed?: No Pre-visit prep was completed: yes AWV questionnaire completed by patient prior to visit?: no Living arrangements:: with family/others Patient's Overall Health Status Rating: good Typical amount of pain: none Does pain affect daily life?: no Are you currently prescribed opioids?: no  Dietary Habits and Nutritional Risks How many meals a day?: 3 (snack in between meals) Eats fruit and vegetables daily?: yes Most meals are obtained by: preparing own meals; eating out (50/50) In the last 2 weeks, have you had any of the following?: none Diabetic:: (!) yes Any non-healing wounds?: no How often do you check your BS?: as needed (once per week) Would you like to be referred to a Nutritionist or for Diabetic Management? : no  Functional Status Activities of Daily Living (to include ambulation/medication): Independent Ambulation: Independent Medication Administration: Independent Home Management (perform basic housework or laundry): Independent Manage your own finances?: yes Primary transportation is:  family / friends Concerns about vision?: no *vision screening is required for WTM* (wears glasses- NO MD) Concerns about hearing?: no  Fall Screening Falls in the past year?: 0 Number of falls in past year: 0 Was there an injury with Fall?: 0 Fall Risk Category Calculator: 0 Patient Fall Risk Level: Low Fall Risk  Fall Risk Patient at Risk for Falls Due to: No Fall Risks Fall risk Follow up: Falls evaluation completed; Falls prevention discussed  Home and Transportation Safety: All rugs have non-skid backing?: yes All stairs or steps have railings?: yes Grab bars in the bathtub or shower?: (!) no Have non-skid surface in bathtub or shower?: yes Good home lighting?: yes Regular seat belt use?: yes Hospital stays in the last year:: (!) yes How many hospital stays:: 1 Reason: MEDICATION REGULATION  Cognitive Assessment Difficulty concentrating, remembering, or making decisions? : no Will 6CIT or Mini Cog be Completed: yes What year is it?: 0 points What month is it?: 0 points Give patient an address phrase to remember (5 components): [address] [place], [place] About what time is it?: 0 points Count backwards from 20 to 1: 0 points Say the months of the year in reverse: 0 points Repeat the address phrase from earlier: 0 points 6 CIT Score: 0 points  Advance Directives (For Healthcare) Does Patient Have a Medical Advance Directive?: No Would patient like information on creating a medical advance directive?: No - Patient declined  Reviewed/Updated  Reviewed/Updated: Reviewed All (Medical, Surgical, Family, Medications, Allergies, Care Teams, Patient Goals)    Allergies (verified) Penicillins   Current Medications (verified) Outpatient Encounter Medications as of 07/25/2024  Medication Sig   atorvastatin  (LIPITOR) 10 MG tablet Take 1 tablet (10 mg total) by mouth daily.   benztropine  (COGENTIN ) 0.5 MG tablet Take 1  tablet (0.5 mg total) by mouth 2 (two) times daily.    escitalopram  (LEXAPRO ) 20 MG tablet Take 1 tablet (20 mg total) by mouth daily.   gabapentin  (NEURONTIN ) 100 MG capsule Take 1 capsule (100 mg total) by mouth 2 (two) times daily.   metFORMIN  (GLUCOPHAGE ) 1000 MG tablet Take 1 tablet (1,000 mg total) by mouth 2 (two) times daily.   OCALIVA  5 MG TABS Take 1 tablet by mouth daily.   omeprazole  (PRILOSEC) 20 MG capsule Take 1 capsule (20 mg total) by mouth daily.   OZEMPIC , 1 MG/DOSE, 4 MG/3ML SOPN Inject 1 mg into the skin once a week.   QUEtiapine  (SEROQUEL ) 300 MG tablet Take 1 tablet (300 mg total) by mouth at bedtime.   ursodiol  (ACTIGALL ) 500 MG tablet Take 1 tablet (500 mg total) by mouth in the morning and at bedtime. TAKE 2 TABLETS BY MOUTH IN AM AND 1 TABLET IN THE PM.   Seladelpar Lysine  (LIVDELZI ) 10 MG CAPS Take 10 mg by mouth daily at 12 noon.   No facility-administered encounter medications on file as of 07/25/2024.    History: Past Medical History:  Diagnosis Date   Allergy    RHINITIS   Bilateral leg weakness 04/15/2016   Depression    sees psychiatrist in [place]   Diabetes mellitus without complication (HCC)    Dyslipidemia    Elevated rheumatoid factor 01/13/2016   GERD (gastroesophageal reflux disease)    Hepatitis C    Migraines    Postmenopausal 05/29/2017   Primary biliary cholangitis (HCC)    Schizoaffective disorder (HCC)    Past Surgical History:  Procedure Laterality Date   CHOLECYSTECTOMY  07/20/1995   TUBAL LIGATION     Family History  Problem Relation Age of Onset   COPD Mother    Cirrhosis Mother    Kidney disease Father    Gout Father    Heart disease Father        CHF   Diabetes Father    Breast cancer Paternal Aunt    Diabetes Maternal Grandmother    Diabetes Paternal Grandmother    Cancer Neg Hx    Social History   Occupational History   Occupation: Building Control Surveyor: [employer]  Tobacco Use   Smoking status: Never   Smokeless tobacco: Never   Tobacco comments:    passive  exposure (from son)  Vaping Use   Vaping status: Never Used  Substance and Sexual Activity   Alcohol use: No   Drug use: No   Sexual activity: Not Currently   Tobacco Counseling Counseling given: Not Answered Tobacco comments: passive exposure (from son)  SDOH Screenings   Food Insecurity: No Food Insecurity (07/25/2024)  Housing: Unknown (07/25/2024)  Transportation Needs: No Transportation Needs (07/25/2024)  Utilities: Not At Risk (07/25/2024)  Depression (PHQ2-9): Low Risk (07/25/2024)  Recent Concern: Depression (PHQ2-9) - Medium Risk (07/17/2024)  Physical Activity: Insufficiently Active (07/25/2024)  Social Connections: Socially Isolated (07/25/2024)  Stress: No Stress Concern Present (07/25/2024)  Tobacco Use: Low Risk (07/25/2024)  Health Literacy: Adequate Health Literacy (07/25/2024)   See flowsheets for full screening details  Depression Screen PHQ 2 & 9 Depression Scale- Over the past 2 weeks, how often have you been bothered by any of the following problems? Little interest or pleasure in doing things: 0 Feeling down, depressed, or hopeless (PHQ Adolescent also includes...irritable): 0 PHQ-2 Total Score: 0 Trouble falling or staying asleep, or sleeping too much: 0 Feeling tired or having  little energy: 0 Poor appetite or overeating (PHQ Adolescent also includes...weight loss): 0 Feeling bad about yourself - or that you are a failure or have let yourself or your family down: 0 Trouble concentrating on things, such as reading the newspaper or watching television (PHQ Adolescent also includes...like school work): 0 Moving or speaking so slowly that other people could have noticed. Or the opposite - being so fidgety or restless that you have been moving around a lot more than usual: 0 Thoughts that you would be better off dead, or of hurting yourself in some way: 0 PHQ-9 Total Score: 0 If you checked off any problems, how difficult have these problems made it for you to do your work,  take care of things at home, or get along with other people?: Not difficult at all  Depression Treatment Depression Interventions/Treatment : Referral to Psychiatry     Goals Addressed             This Visit's Progress    DIET - EAT MORE FRUITS AND VEGETABLES               Objective:    There were no vitals filed for this visit. There is no height or weight on file to calculate BMI.  Hearing/Vision screen Hearing Screening - Comments:: NO AIDS Vision Screening - Comments:: GLASSES- NO MD Immunizations and Health Maintenance Health Maintenance  Topic Date Due   Pneumococcal Vaccine: 50+ Years (1 of 2 - PCV) Never done   Colonoscopy  Never done   DTaP/Tdap/Td (2 - Td or Tdap) 04/10/2022   Influenza Vaccine  02/17/2024   COVID-19 Vaccine (3 - 2025-26 season) 03/19/2024   HIV Screening  05/17/2025 (Originally 11/20/1980)   HEMOGLOBIN A1C  11/15/2024   OPHTHALMOLOGY EXAM  01/16/2025   Diabetic kidney evaluation - eGFR measurement  05/17/2025   Diabetic kidney evaluation - Urine ACR  05/17/2025   FOOT EXAM  06/22/2025   Medicare Annual Wellness (AWV)  07/25/2025   Mammogram  06/01/2026   Cervical Cancer Screening (HPV/Pap Cotest)  06/22/2029   Hepatitis B Vaccines 19-59 Average Risk  Completed   Hepatitis C Screening  Completed   Zoster Vaccines- Shingrix  Completed   HPV VACCINES  Aged Out   Meningococcal B Vaccine  Aged Out        Assessment/Plan:  This is a routine wellness examination for Jessica Watkins.  Patient Care Team: Franchot Isaiah LABOR, MD as PCP - General (Family Medicine)  I have personally reviewed and noted the following in the patients chart:   Medical and social history Use of alcohol, tobacco or illicit drugs  Current medications and supplements including opioid prescriptions. Functional ability and status Nutritional status Physical activity Advanced directives List of other physicians Hospitalizations, surgeries, and ER visits in previous 12  months Vitals Screenings to include cognitive, depression, and falls Referrals and appointments  No orders of the defined types were placed in this encounter.  In addition, I have reviewed and discussed with patient certain preventive protocols, quality metrics, and best practice recommendations. A written personalized care plan for preventive services as well as general preventive health recommendations were provided to patient.   Jessica GORMAN Das, LPN   02/18/7972   Return in 1 year (on 07/25/2025).  After Visit Summary: (MyChart) Due to this being a telephonic visit, the after visit summary with patients personalized plan was offered to patient via MyChart   Nurse Notes: DOESN'T TAKE FLU SHOTS; NEEDS TDAP; UTD ON MAMMOGRAM; COLOGUARD  ORDERED; UTD ON EYE EXAM "

## 2024-07-27 ENCOUNTER — Encounter

## 2024-07-30 ENCOUNTER — Ambulatory Visit: Admitting: Family Medicine

## 2024-08-07 ENCOUNTER — Telehealth: Payer: Self-pay

## 2024-08-07 NOTE — Telephone Encounter (Signed)
 LVM letting  patient know Dr.Hernandez has Friday openings for Colonoscopy - around second week of February and after- She will need to hold Ozempic  7 days and Metformin  2 days

## 2024-08-08 NOTE — Telephone Encounter (Signed)
 Mailed letter for patient to call office so we can schedule Colonoscopy- multiple attempts made to reach patient via phone failed.

## 2024-08-20 ENCOUNTER — Other Ambulatory Visit (HOSPITAL_COMMUNITY): Payer: Self-pay

## 2024-08-22 ENCOUNTER — Telehealth: Payer: Self-pay

## 2024-08-22 ENCOUNTER — Other Ambulatory Visit (HOSPITAL_COMMUNITY): Payer: Self-pay

## 2024-08-22 NOTE — Telephone Encounter (Signed)
 Pharmacy Patient Advocate Encounter   Received notification from [facility] that prior authorization for OZEMPIC  1MG  is required/requested.   Insurance verification completed.   The patient is insured through [facility].   Per test claim: PA required; PA submitted to above mentioned insurance via Latent Key/confirmation #/EOC [id number] Status is pending

## 2024-08-23 ENCOUNTER — Other Ambulatory Visit (HOSPITAL_COMMUNITY): Payer: Self-pay

## 2024-08-23 NOTE — Telephone Encounter (Signed)
 Pharmacy Patient Advocate Encounter  Received notification from [facility] MEDICARE that Prior Authorization for OZEMPIC  1MG  has been APPROVED from 08/22/2024 to 07/18/2025. Unable to obtain price due to refill too soon rejection, last fill date 08/23/2024 next available fill date2/26/2026   PA #/Case ID/Reference #: [id number]

## 2024-08-24 ENCOUNTER — Other Ambulatory Visit: Payer: Self-pay

## 2024-08-24 DIAGNOSIS — K743 Primary biliary cirrhosis: Secondary | ICD-10-CM

## 2024-09-07 ENCOUNTER — Ambulatory Visit

## 2024-12-21 ENCOUNTER — Ambulatory Visit

## 2025-07-30 ENCOUNTER — Ambulatory Visit
